# Patient Record
Sex: Female | Born: 1990 | Race: White | Hispanic: No | Marital: Married | Smoking: Former smoker
Health system: Southern US, Community
[De-identification: ages and names within clinical notes are randomized; demographics above are authoritative.]

## PROBLEM LIST (undated history)

## (undated) ENCOUNTER — Inpatient Hospital Stay: Payer: Self-pay

## (undated) DIAGNOSIS — B1920 Unspecified viral hepatitis C without hepatic coma: Secondary | ICD-10-CM

## (undated) DIAGNOSIS — B977 Papillomavirus as the cause of diseases classified elsewhere: Secondary | ICD-10-CM

## (undated) DIAGNOSIS — G43909 Migraine, unspecified, not intractable, without status migrainosus: Secondary | ICD-10-CM

## (undated) DIAGNOSIS — F1911 Other psychoactive substance abuse, in remission: Secondary | ICD-10-CM

## (undated) DIAGNOSIS — Z22322 Carrier or suspected carrier of Methicillin resistant Staphylococcus aureus: Secondary | ICD-10-CM

## (undated) DIAGNOSIS — R87612 Low grade squamous intraepithelial lesion on cytologic smear of cervix (LGSIL): Secondary | ICD-10-CM

## (undated) DIAGNOSIS — Z8742 Personal history of other diseases of the female genital tract: Secondary | ICD-10-CM

## (undated) HISTORY — DX: Migraine, unspecified, not intractable, without status migrainosus: G43.909

## (undated) HISTORY — DX: Personal history of other diseases of the female genital tract: Z87.42

## (undated) HISTORY — PX: COLONOSCOPY: SHX174

## (undated) HISTORY — DX: Papillomavirus as the cause of diseases classified elsewhere: B97.7

## (undated) HISTORY — DX: Unspecified viral hepatitis C without hepatic coma: B19.20

## (undated) HISTORY — DX: Other psychoactive substance abuse, in remission: F19.11

## (undated) HISTORY — DX: Low grade squamous intraepithelial lesion on cytologic smear of cervix (LGSIL): R87.612

## (undated) HISTORY — DX: Carrier or suspected carrier of methicillin resistant Staphylococcus aureus: Z22.322

## (undated) HISTORY — PX: TONSILLECTOMY: SUR1361

---

## 2015-05-21 HISTORY — PX: COLPOSCOPY: SHX161

## 2015-12-04 DIAGNOSIS — F411 Generalized anxiety disorder: Secondary | ICD-10-CM | POA: Insufficient documentation

## 2015-12-04 DIAGNOSIS — F1911 Other psychoactive substance abuse, in remission: Secondary | ICD-10-CM | POA: Insufficient documentation

## 2016-01-19 DIAGNOSIS — Z22322 Carrier or suspected carrier of Methicillin resistant Staphylococcus aureus: Secondary | ICD-10-CM

## 2016-01-19 HISTORY — DX: Carrier or suspected carrier of methicillin resistant Staphylococcus aureus: Z22.322

## 2016-12-30 DIAGNOSIS — R87612 Low grade squamous intraepithelial lesion on cytologic smear of cervix (LGSIL): Secondary | ICD-10-CM

## 2016-12-30 HISTORY — DX: Low grade squamous intraepithelial lesion on cytologic smear of cervix (LGSIL): R87.612

## 2017-03-12 ENCOUNTER — Emergency Department (HOSPITAL_COMMUNITY)
Admission: EM | Admit: 2017-03-12 | Discharge: 2017-03-12 | Disposition: A | Discharge status: Home / Self Care | Payer: Medicaid Other | Attending: Emergency Medicine | Admitting: Emergency Medicine

## 2017-03-12 ENCOUNTER — Emergency Department (HOSPITAL_COMMUNITY): Payer: Medicaid Other

## 2017-03-12 ENCOUNTER — Encounter (HOSPITAL_COMMUNITY): Payer: Self-pay | Admitting: Cardiology

## 2017-03-12 DIAGNOSIS — O209 Hemorrhage in early pregnancy, unspecified: Secondary | ICD-10-CM

## 2017-03-12 DIAGNOSIS — Z3A01 Less than 8 weeks gestation of pregnancy: Secondary | ICD-10-CM | POA: Diagnosis not present

## 2017-03-12 DIAGNOSIS — R109 Unspecified abdominal pain: Secondary | ICD-10-CM | POA: Insufficient documentation

## 2017-03-12 DIAGNOSIS — O208 Other hemorrhage in early pregnancy: Secondary | ICD-10-CM | POA: Diagnosis present

## 2017-03-12 DIAGNOSIS — O200 Threatened abortion: Secondary | ICD-10-CM | POA: Diagnosis not present

## 2017-03-12 LAB — COMPREHENSIVE METABOLIC PANEL
ALBUMIN: 4.4 g/dL (ref 3.5–5.0)
ALK PHOS: 111 U/L (ref 38–126)
ALT: 594 U/L — ABNORMAL HIGH (ref 14–54)
ANION GAP: 7 (ref 5–15)
AST: 287 U/L — ABNORMAL HIGH (ref 15–41)
BUN: 11 mg/dL (ref 6–20)
CALCIUM: 9.7 mg/dL (ref 8.9–10.3)
CHLORIDE: 105 mmol/L (ref 101–111)
CO2: 27 mmol/L (ref 22–32)
Creatinine, Ser: 0.91 mg/dL (ref 0.44–1.00)
GFR calc Af Amer: >60 mL/min (ref >60)
GFR calc non Af Amer: >60 mL/min (ref >60)
GLUCOSE: 87 mg/dL (ref 65–99)
POTASSIUM: 4 mmol/L (ref 3.5–5.1)
SODIUM: 139 mmol/L (ref 135–145)
Total Bilirubin: 0.6 mg/dL (ref 0.3–1.2)
Total Protein: 8 g/dL (ref 6.5–8.1)

## 2017-03-12 LAB — CBC WITH DIFFERENTIAL/PLATELET
BASOS PCT: 0 %
Basophils Absolute: 0 10*3/uL (ref 0.0–0.1)
EOS ABS: 0.3 10*3/uL (ref 0.0–0.7)
EOS PCT: 4 %
HCT: 45.1 % (ref 36.0–46.0)
HEMOGLOBIN: 14.8 g/dL (ref 12.0–15.0)
Lymphocytes Relative: 19 %
Lymphs Abs: 1.4 10*3/uL (ref 0.7–4.0)
MCH: 30.5 pg (ref 26.0–34.0)
MCHC: 32.8 g/dL (ref 30.0–36.0)
MCV: 93 fL (ref 78.0–100.0)
MONOS PCT: 6 %
Monocytes Absolute: 0.5 10*3/uL (ref 0.1–1.0)
NEUTROS PCT: 71 %
Neutro Abs: 5.3 10*3/uL (ref 1.7–7.7)
PLATELETS: 179 10*3/uL (ref 150–400)
RBC: 4.85 MIL/uL (ref 3.87–5.11)
RDW: 12.2 % (ref 11.5–15.5)
WBC: 7.4 10*3/uL (ref 4.0–10.5)

## 2017-03-12 LAB — I-STAT BETA HCG BLOOD, ED (MC, WL, AP ONLY): HCG, QUANTITATIVE: 22.8 m[IU]/mL — AB (ref <5)

## 2017-03-12 LAB — HCG, QUANTITATIVE, PREGNANCY: hCG, Beta Chain, Quant, S: 21 m[IU]/mL — ABNORMAL HIGH (ref <5)

## 2017-03-12 LAB — TYPE AND SCREEN
ABO/RH(D): O POS
ANTIBODY SCREEN: NEGATIVE

## 2017-03-12 MED ORDER — IBUPROFEN 800 MG PO TABS: 800.0000 mg | ORAL_TABLET | Freq: Three times a day (TID) | ORAL | 0 refills | Status: DC | PRN

## 2017-03-12 MED ORDER — SODIUM CHLORIDE 0.9 % IV BOLUS (SEPSIS)
1000.0000 mL | Freq: Once | INTRAVENOUS | Status: AC
  Administered 2017-03-12: 1000 mL via INTRAVENOUS

## 2017-03-12 NOTE — ED Triage Notes (Signed)
[redacted] weeks pregnant.  Vaginal bleeding and  Left sided abdominal pain that started this morning.

## 2017-03-12 NOTE — ED Notes (Signed)
Pt back from ultrasound.

## 2017-03-12 NOTE — ED Notes (Signed)
Pt has noticed a lot of vaginal bleeding this morning and states it ran half way down her legs. Is having a slight amount of cramping as well.

## 2017-03-12 NOTE — ED Notes (Signed)
Vaginal Examination kit being set up by NT

## 2017-03-12 NOTE — Discharge Instructions (Signed)
Follow-up with Dr.Eure in 2 weeks.  Also follow-up with your stomach specialist in 2-3 weeks for your elevated liver studies

## 2017-03-12 NOTE — ED Provider Notes (Signed)
[facility] EMERGENCY DEPARTMENT Provider Note   CSN: [id number] Arrival date & time: 03/12/17  1111     History   Chief Complaint Chief Complaint  Patient presents with  . Abdominal Pain    HPI April Keller is a 26 y.o. female.  Patient states that she is pregnant and started having bleeding and abdominal pain today   The history is provided by the patient. No language interpreter was used.  Abdominal Pain   This is a new problem. The current episode started more than 2 days ago. The problem occurs constantly. The problem has not changed since onset.The pain is associated with an unknown factor. The pain is located in the epigastric region. The quality of the pain is aching. The pain is at a severity of 4/10. The pain is moderate. Pertinent negatives include anorexia, diarrhea, frequency, hematuria and headaches. Nothing aggravates the symptoms. Nothing relieves the symptoms.    History reviewed. No pertinent past medical history.  There are no active problems to display for this patient.   Past Surgical History:  Procedure Laterality Date  . TONSILLECTOMY      OB History    Gravida Para Term Preterm AB Living   1             SAB TAB Ectopic Multiple Live Births                   Home Medications    Prior to Admission medications   Medication Sig Start Date End Date Taking? Authorizing Provider  ibuprofen (ADVIL,MOTRIN) 800 MG tablet Take 1 tablet (800 mg total) by mouth every 8 (eight) hours as needed for moderate pain. 03/12/17   Bethann Berkshire, MD    Family History History reviewed. No pertinent family history.  Social History Social History  Substance Use Topics  . Smoking status: Never Smoker  . Smokeless tobacco: Current User  . Alcohol use No     Allergies   Patient has no known allergies.   Review of Systems Review of Systems  Constitutional: Negative for appetite change and fatigue.  HENT: Negative for congestion, ear discharge and  sinus pressure.   Eyes: Negative for discharge.  Respiratory: Negative for cough.   Cardiovascular: Negative for chest pain.  Gastrointestinal: Positive for abdominal pain. Negative for anorexia and diarrhea.  Genitourinary: Negative for frequency and hematuria.  Musculoskeletal: Negative for back pain.  Skin: Negative for rash.  Neurological: Negative for seizures and headaches.  Psychiatric/Behavioral: Negative for hallucinations.     Physical Exam Updated Vital Signs BP 109/68 (BP Location: Right Arm)   Pulse 70   Temp 98.6 F (37 C) (Oral)   Resp 17   Ht 5' 8.5" (1.74 m)   Wt 90.7 kg (200 lb)   LMP 02/02/2017   SpO2 100%   BMI 29.97 kg/m   Physical Exam  Constitutional: She is oriented to person, place, and time. She appears well-developed.  HENT:  Head: Normocephalic.  Eyes: Conjunctivae and EOM are normal. No scleral icterus.  Neck: Neck supple. No thyromegaly present.  Cardiovascular: Normal rate and regular rhythm.  Exam reveals no gallop and no friction rub.   No murmur heard. Pulmonary/Chest: No stridor. She has no wheezes. She has no rales. She exhibits no tenderness.  Abdominal: She exhibits no distension. There is no tenderness. There is no rebound.  Genitourinary:  Genitourinary Comments: Os closed.   Mild bleeding vaginal vault  Musculoskeletal: Normal range of motion. She exhibits no edema.  Lymphadenopathy:    She has no cervical adenopathy.  Neurological: She is oriented to person, place, and time. She exhibits normal muscle tone. Coordination normal.  Skin: No rash noted. No erythema.  Psychiatric: She has a normal mood and affect. Her behavior is normal.     ED Treatments / Results  Labs (all labs ordered are listed, but only abnormal results are displayed) Labs Reviewed  HCG, QUANTITATIVE, PREGNANCY - Abnormal; Notable for the following:       Result Value   hCG, Beta Chain, Quant, S 21 (*)    All other components within normal limits    COMPREHENSIVE METABOLIC PANEL - Abnormal; Notable for the following:    AST 287 (*)    ALT 594 (*)    All other components within normal limits  I-STAT BETA HCG BLOOD, ED ([facility], [facility], [facility] ONLY) - Abnormal; Notable for the following:    I-stat hCG, quantitative 22.8 (*)    All other components within normal limits  CBC WITH DIFFERENTIAL/PLATELET  TYPE AND SCREEN    EKG  EKG Interpretation None       Radiology US Ob Comp Less 14 Wks  Result Date: 03/12/2017 CLINICAL DATA:  Pregnant at approximately 6 weeks, vaginal bleeding affecting early pregnancy, LEFT lower quadrant discomfort for 1 day ; EGA [redacted] weeks 3 days by LMP ; quantitative beta HCG = 22.8 EXAM: OBSTETRIC <14 WK [place] AND TRANSVAGINAL OB US TECHNIQUE: Both transabdominal and transvaginal ultrasound examinations were performed for complete evaluation of the gestation as well as the maternal uterus, adnexal regions, and pelvic cul-de-sac. Transvaginal technique was performed to assess early pregnancy. COMPARISON:  None FINDINGS: Intrauterine gestational sac: Not identified Yolk sac:  N/A Embryo:  N/A Cardiac Activity: N/A Heart Rate: N/A  bpm Subchorionic hemorrhage:  N/A Maternal uterus/adnexae: Uterus normal appearance with normal thickness of endometrial complex. No uterine mass, fluid collection or gestational sac. Tiny amount of fluid seen within the endometrial canal at the lower uterine segment. Ovaries normal appearance with small corpus luteal cyst noted in LEFT ovary. No free pelvic fluid or adnexal masses. IMPRESSION: No intrauterine gestation identified. Findings are compatible with pregnancy of unknown location. Differential diagnosis includes early intrauterine pregnancy too early to visualize, spontaneous abortion, and ectopic pregnancy. Serial quantitative beta HCG and or followup ultrasound recommended to definitively exclude ectopic pregnancy. Electronically Signed   By: Ulyses Southward M.D.   On: 03/12/2017 13:12   US Ob  Transvaginal  Result Date: 03/12/2017 CLINICAL DATA:  Pregnant at approximately 6 weeks, vaginal bleeding affecting early pregnancy, LEFT lower quadrant discomfort for 1 day ; EGA [redacted] weeks 3 days by LMP ; quantitative beta HCG = 22.8 EXAM: OBSTETRIC <14 WK [place] AND TRANSVAGINAL OB US TECHNIQUE: Both transabdominal and transvaginal ultrasound examinations were performed for complete evaluation of the gestation as well as the maternal uterus, adnexal regions, and pelvic cul-de-sac. Transvaginal technique was performed to assess early pregnancy. COMPARISON:  None FINDINGS: Intrauterine gestational sac: Not identified Yolk sac:  N/A Embryo:  N/A Cardiac Activity: N/A Heart Rate: N/A  bpm Subchorionic hemorrhage:  N/A Maternal uterus/adnexae: Uterus normal appearance with normal thickness of endometrial complex. No uterine mass, fluid collection or gestational sac. Tiny amount of fluid seen within the endometrial canal at the lower uterine segment. Ovaries normal appearance with small corpus luteal cyst noted in LEFT ovary. No free pelvic fluid or adnexal masses. IMPRESSION: No intrauterine gestation identified. Findings are compatible with pregnancy of unknown location. Differential diagnosis includes  early intrauterine pregnancy too early to visualize, spontaneous abortion, and ectopic pregnancy. Serial quantitative beta HCG and or followup ultrasound recommended to definitively exclude ectopic pregnancy. Electronically Signed   By: Ulyses SouthwardMark  Boles M.D.   On: 03/12/2017 13:12    Procedures Procedures (including critical care time)  Medications Ordered in ED Medications  sodium chloride 0.9 % bolus 1,000 mL (1,000 mLs Intravenous New Bag/Given 03/12/17 1222)     Initial Impression / Assessment and Plan / ED Course  I have reviewed the triage vital signs and the nursing notes.  Pertinent labs & imaging results that were available during my care of the patient were reviewed by me and considered in my medical  decision making (see chart for details).    Patient with spontaneous abortion.  Also has elevated liver enzymes from history of hepatitis C.  She will follow-up with OB/GYN in 2 weeks to follow her quantitative beta hCGs and will follow up with her GI doctor to check her liver studies   Final Clinical Impressions(s) / ED Diagnoses   Final diagnoses:  Threatened abortion    New Prescriptions New Prescriptions   IBUPROFEN (ADVIL,MOTRIN) 800 MG TABLET    Take 1 tablet (800 mg total) by mouth every 8 (eight) hours as needed for moderate pain.     Bethann BerkshireZammit, Teigen Parslow, MD 03/12/17 1504

## 2017-05-20 NOTE — L&D Delivery Note (Signed)
Delivery Note At 8:45 PM a viable female infant was delivered via Vaginal, Spontaneous (Presentation: OA).  APGAR: 8, 9; weight pending .   Placenta status: delivered spontaneously, intact.  Cord: 3VC with the following complications: None.  Cord pH: n/a  Anesthesia: epidural  Episiotomy: None Lacerations: None Suture Repair: n/a Est. Blood Loss (mL): 350  Mom to postpartum.  Baby to Couplet care / Skin to Skin.  Called to see patient.  Mom pushed to deliver a viable female infant.  The head followed by shoulders, which delivered without difficulty, and the rest of the body.  No nuchal cord noted.  Baby to mom's chest.  Cord clamped and cut after > 1 min delay.  No cord blood obtained.  Placenta delivered spontaneously, intact, with a 3-vessel cord.  No vaginal, cervical, or perineal lacerations.   Small abrasions on vulva, hemostatic without repair. All counts correct.  Hemostasis obtained with IV pitocin and fundal massage. EBL 350 mL.     Thomasene MohairStephen Jackson, MD 02/03/2018, 9:04 PM

## 2017-06-18 ENCOUNTER — Other Ambulatory Visit (INDEPENDENT_AMBULATORY_CARE_PROVIDER_SITE_OTHER): Payer: Medicaid Other

## 2017-06-18 ENCOUNTER — Ambulatory Visit (INDEPENDENT_AMBULATORY_CARE_PROVIDER_SITE_OTHER): Payer: Medicaid Other | Admitting: Certified Nurse Midwife

## 2017-06-18 ENCOUNTER — Encounter: Payer: Self-pay | Admitting: Certified Nurse Midwife

## 2017-06-18 VITALS — BP 110/70 | HR 99 | Wt 216.0 lb

## 2017-06-18 DIAGNOSIS — Z23 Encounter for immunization: Secondary | ICD-10-CM

## 2017-06-18 DIAGNOSIS — O0991 Supervision of high risk pregnancy, unspecified, first trimester: Secondary | ICD-10-CM

## 2017-06-18 DIAGNOSIS — O09299 Supervision of pregnancy with other poor reproductive or obstetric history, unspecified trimester: Secondary | ICD-10-CM | POA: Diagnosis not present

## 2017-06-18 DIAGNOSIS — R748 Abnormal levels of other serum enzymes: Secondary | ICD-10-CM

## 2017-06-18 DIAGNOSIS — F1991 Other psychoactive substance use, unspecified, in remission: Secondary | ICD-10-CM

## 2017-06-18 DIAGNOSIS — E669 Obesity, unspecified: Secondary | ICD-10-CM

## 2017-06-18 DIAGNOSIS — Z87898 Personal history of other specified conditions: Secondary | ICD-10-CM

## 2017-06-18 DIAGNOSIS — O0990 Supervision of high risk pregnancy, unspecified, unspecified trimester: Secondary | ICD-10-CM

## 2017-06-18 DIAGNOSIS — F1911 Other psychoactive substance abuse, in remission: Secondary | ICD-10-CM

## 2017-06-18 DIAGNOSIS — R768 Other specified abnormal immunological findings in serum: Secondary | ICD-10-CM

## 2017-06-18 NOTE — Progress Notes (Signed)
NOB today. Pregnancy confirmed at [facility]. Pt c/o nausea.
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
## 2017-06-19 LAB — RPR+RH+ABO+RUB AB+AB SCR+CB...
Antibody Screen: NEGATIVE
HEMATOCRIT: 43.5 % (ref 34.0–46.6)
HEP B S AG: NEGATIVE
HIV Screen 4th Generation wRfx: NONREACTIVE
Hemoglobin: 14.7 g/dL (ref 11.1–15.9)
MCH: 30.6 pg (ref 26.6–33.0)
MCHC: 33.8 g/dL (ref 31.5–35.7)
MCV: 90 fL (ref 79–97)
Platelets: 209 10*3/uL (ref 150–379)
RBC: 4.81 x10E6/uL (ref 3.77–5.28)
RDW: 13.1 % (ref 12.3–15.4)
RH TYPE: POSITIVE
RPR: NONREACTIVE
VARICELLA: 1036 {index} (ref >165)
WBC: 9.8 10*3/uL (ref 3.4–10.8)

## 2017-06-19 LAB — COMPREHENSIVE METABOLIC PANEL
A/G RATIO: 1.4 (ref 1.2–2.2)
ALBUMIN: 4.5 g/dL (ref 3.5–5.5)
ALK PHOS: 147 IU/L — AB (ref 39–117)
ALT: 452 IU/L — ABNORMAL HIGH (ref 0–32)
AST: 186 IU/L — ABNORMAL HIGH (ref 0–40)
BILIRUBIN TOTAL: 0.4 mg/dL (ref 0.0–1.2)
BUN / CREAT RATIO: 11 (ref 9–23)
BUN: 10 mg/dL (ref 6–20)
CHLORIDE: 99 mmol/L (ref 96–106)
CO2: 23 mmol/L (ref 20–29)
Calcium: 9.9 mg/dL (ref 8.7–10.2)
Creatinine, Ser: 0.91 mg/dL (ref 0.57–1.00)
GFR calc non Af Amer: 89 mL/min/{1.73_m2} (ref >59)
GFR, EST AFRICAN AMERICAN: 102 mL/min/{1.73_m2} (ref >59)
GLOBULIN, TOTAL: 3.3 g/dL (ref 1.5–4.5)
Glucose: 74 mg/dL (ref 65–99)
POTASSIUM: 4.5 mmol/L (ref 3.5–5.2)
SODIUM: 138 mmol/L (ref 134–144)
TOTAL PROTEIN: 7.8 g/dL (ref 6.0–8.5)

## 2017-06-20 ENCOUNTER — Encounter: Payer: Self-pay | Admitting: Certified Nurse Midwife

## 2017-06-20 LAB — CHLAMYDIA/GONOCOCCUS/TRICHOMONAS, NAA
CHLAMYDIA BY NAA: NEGATIVE
Gonococcus by NAA: NEGATIVE
TRICH VAG BY NAA: NEGATIVE

## 2017-06-20 LAB — URINE CULTURE

## 2017-06-20 LAB — URINE DRUG PANEL 7
Amphetamines, Urine: NEGATIVE ng/mL
Barbiturate Quant, Ur: NEGATIVE ng/mL
Benzodiazepine Quant, Ur: NEGATIVE ng/mL
CANNABINOID QUANT UR: NEGATIVE ng/mL
Cocaine (Metab.): NEGATIVE ng/mL
Opiate Quant, Ur: NEGATIVE ng/mL
PCP QUANT UR: NEGATIVE ng/mL

## 2017-06-21 LAB — SPECIMEN STATUS REPORT

## 2017-06-21 LAB — HEPATITIS C ANTIBODY: Hep C Virus Ab: >11 s/co ratio — ABNORMAL HIGH (ref 0.0–0.9)

## 2017-06-25 ENCOUNTER — Telehealth: Payer: Self-pay | Admitting: Certified Nurse Midwife

## 2017-06-25 ENCOUNTER — Encounter: Payer: Self-pay | Admitting: Certified Nurse Midwife

## 2017-06-25 DIAGNOSIS — O0990 Supervision of high risk pregnancy, unspecified, unspecified trimester: Secondary | ICD-10-CM | POA: Insufficient documentation

## 2017-06-25 DIAGNOSIS — R768 Other specified abnormal immunological findings in serum: Secondary | ICD-10-CM | POA: Insufficient documentation

## 2017-06-25 DIAGNOSIS — R7401 Elevation of levels of liver transaminase levels: Secondary | ICD-10-CM

## 2017-06-25 DIAGNOSIS — R748 Abnormal levels of other serum enzymes: Secondary | ICD-10-CM | POA: Insufficient documentation

## 2017-06-25 DIAGNOSIS — F1991 Other psychoactive substance use, unspecified, in remission: Secondary | ICD-10-CM | POA: Insufficient documentation

## 2017-06-25 DIAGNOSIS — Z8742 Personal history of other diseases of the female genital tract: Secondary | ICD-10-CM | POA: Insufficient documentation

## 2017-06-25 DIAGNOSIS — F1911 Other psychoactive substance abuse, in remission: Secondary | ICD-10-CM | POA: Insufficient documentation

## 2017-06-25 DIAGNOSIS — E669 Obesity, unspecified: Secondary | ICD-10-CM | POA: Insufficient documentation

## 2017-06-25 DIAGNOSIS — Z87898 Personal history of other specified conditions: Secondary | ICD-10-CM | POA: Insufficient documentation

## 2017-06-25 DIAGNOSIS — R740 Nonspecific elevation of levels of transaminase and lactic acid dehydrogenase [LDH]: Secondary | ICD-10-CM

## 2017-06-25 NOTE — Telephone Encounter (Signed)
Patient called regarding lab work. AST, ALT, and alkaline phosphatase all elevated with positive hepatitis C antibody. Will have patient come in for hepatitis C RNA test and refer to hepatologist for further evaluation. [facility]

## 2017-06-26 ENCOUNTER — Other Ambulatory Visit: Payer: Medicaid Other

## 2017-06-26 DIAGNOSIS — O0990 Supervision of high risk pregnancy, unspecified, unspecified trimester: Secondary | ICD-10-CM

## 2017-06-26 DIAGNOSIS — R7401 Elevation of levels of liver transaminase levels: Secondary | ICD-10-CM

## 2017-06-26 DIAGNOSIS — R768 Other specified abnormal immunological findings in serum: Secondary | ICD-10-CM

## 2017-06-26 DIAGNOSIS — R740 Nonspecific elevation of levels of transaminase and lactic acid dehydrogenase [LDH]: Secondary | ICD-10-CM

## 2017-06-26 DIAGNOSIS — E669 Obesity, unspecified: Secondary | ICD-10-CM

## 2017-06-28 LAB — HEPATITIS C VRS RNA DETECT BY PCR-QUAL: HCV RNA NAA QUALITATIVE: POSITIVE — AB

## 2017-06-30 ENCOUNTER — Telehealth: Payer: Self-pay | Admitting: Certified Nurse Midwife

## 2017-06-30 DIAGNOSIS — B1920 Unspecified viral hepatitis C without hepatic coma: Secondary | ICD-10-CM

## 2017-06-30 DIAGNOSIS — R748 Abnormal levels of other serum enzymes: Secondary | ICD-10-CM

## 2017-06-30 DIAGNOSIS — Z3A08 8 weeks gestation of pregnancy: Secondary | ICD-10-CM

## 2017-06-30 DIAGNOSIS — F1911 Other psychoactive substance abuse, in remission: Secondary | ICD-10-CM

## 2017-06-30 NOTE — Telephone Encounter (Signed)
Patient called and advised that her Hepatitis C RNA was positive. Will consult MFM and will probably need consult to hepatologist. Farrel Connersolleen Alba Perillo, CNM

## 2017-07-10 ENCOUNTER — Ambulatory Visit
Admission: RE | Admit: 2017-07-10 | Discharge: 2017-07-10 | Disposition: A | Discharge status: Home / Self Care | Payer: Medicaid Other | Source: Ambulatory Visit | Attending: Obstetrics & Gynecology | Admitting: Obstetrics & Gynecology

## 2017-07-10 VITALS — BP 121/77 | HR 90 | Temp 98.2°F | Resp 18 | Wt 219.4 lb

## 2017-07-10 DIAGNOSIS — R748 Abnormal levels of other serum enzymes: Secondary | ICD-10-CM

## 2017-07-10 DIAGNOSIS — O98411 Viral hepatitis complicating pregnancy, first trimester: Secondary | ICD-10-CM | POA: Diagnosis present

## 2017-07-10 DIAGNOSIS — Z333 Pregnant state, gestational carrier: Secondary | ICD-10-CM

## 2017-07-10 DIAGNOSIS — R768 Other specified abnormal immunological findings in serum: Secondary | ICD-10-CM | POA: Diagnosis not present

## 2017-07-10 DIAGNOSIS — F1991 Other psychoactive substance use, unspecified, in remission: Secondary | ICD-10-CM

## 2017-07-10 DIAGNOSIS — Z3A09 9 weeks gestation of pregnancy: Secondary | ICD-10-CM | POA: Insufficient documentation

## 2017-07-10 DIAGNOSIS — Z87898 Personal history of other specified conditions: Secondary | ICD-10-CM | POA: Diagnosis not present

## 2017-07-10 DIAGNOSIS — R7989 Other specified abnormal findings of blood chemistry: Secondary | ICD-10-CM | POA: Insufficient documentation

## 2017-07-10 DIAGNOSIS — E669 Obesity, unspecified: Secondary | ICD-10-CM

## 2017-07-10 DIAGNOSIS — B1920 Unspecified viral hepatitis C without hepatic coma: Secondary | ICD-10-CM | POA: Diagnosis not present

## 2017-07-10 LAB — PROTIME-INR
INR: 0.97
Prothrombin Time: 12.8 seconds (ref 11.4–15.2)

## 2017-07-10 LAB — COMPREHENSIVE METABOLIC PANEL
ALBUMIN: 4.1 g/dL (ref 3.5–5.0)
ALK PHOS: 80 U/L (ref 38–126)
ALT: 90 U/L — AB (ref 14–54)
AST: 40 U/L (ref 15–41)
Anion gap: 9 (ref 5–15)
BUN: 13 mg/dL (ref 6–20)
CALCIUM: 9.2 mg/dL (ref 8.9–10.3)
CO2: 22 mmol/L (ref 22–32)
CREATININE: 0.83 mg/dL (ref 0.44–1.00)
Chloride: 104 mmol/L (ref 101–111)
GFR calc Af Amer: >60 mL/min (ref >60)
GFR calc non Af Amer: >60 mL/min (ref >60)
GLUCOSE: 87 mg/dL (ref 65–99)
POTASSIUM: 3.9 mmol/L (ref 3.5–5.1)
Sodium: 135 mmol/L (ref 135–145)
TOTAL PROTEIN: 8.2 g/dL — AB (ref 6.5–8.1)
Total Bilirubin: 0.5 mg/dL (ref 0.3–1.2)

## 2017-07-10 LAB — FIBRINOGEN: Fibrinogen: 525 mg/dL — ABNORMAL HIGH (ref 210–475)

## 2017-07-10 LAB — APTT: aPTT: 29 seconds (ref 24–36)

## 2017-07-10 NOTE — Telephone Encounter (Signed)
[facility] to [facility] do to high risk pregnancy.  [facility] and they will call patient with appointment date and  time.

## 2017-07-10 NOTE — Progress Notes (Addendum)
[place] Consultation  Referring Provider: Mosetta Pigeon Reason for Referral: Pregnancy with hepatitis C  April Keller is a 27 year-old G2 P0010 at 80 3/7 weeks ([place] 02/09/18) who is referred for recommendations concerning the management of hepatitis C in pregnancy.  Ms. April Keller was diagnosed with Hepatitis C approximately 4-5 years ago when she presented to a hospital with flu-like symptoms.  She was diagnosed with having hepatitis C and then was followed by a GI group in [facility].  She ultimately cleared the virus without treatment.   At the time she presented she was using IV drugs (heroin, meth, cocaine, THC) but has now been clean for over 2 years. She is not on opioid maintenance  She overall feels well. Routine blood work at her new OB visit demonstrated that her LFTs were elevated and that her PCR test is now positive again.  She denies a history of jaundice.  She denies vaginal bleeding or abdominal pain. No easy bruising or bleeding complications.  She is not sure if she has had a right upper quadrant ultrasound recently or if she has been tested for hepatitis A.   PMH: Hep C positive with recent PCR positive  PSH: Tonsillectomy PObH: G2 P0010   2018; SAB at 5 weeks, no D&C required PGYN: History of HPV and abnromal paps. No conization or LEEP.  Meds: PNV All: KNDA SH: No longer using drugs. Now vapes. No tobacco. Lives with husband. Safe at home. Unemployed FH: No FH of babies with birth defects, MR or genetic disorders ROS: See HPI  Exam:  Vitals:   07/10/17 0847  BP: 121/77  Pulse: 90  Resp: 18  Temp: 98.2 F (36.8 C)  SpO2: 97%    Prenatal Labs [facility], 07/02/17): Blood type O positive, antibody screen neg, Rubella Non-immue, Hep B neg, RPR non-reactive, Varicella immune, Hep C antibody positive, Hep C viral pcr positive, Hct 43.5, MCV 90, Plt 209  06/18/17: AST 186, ALT 452, Alk phos 147, total bili 0.4, Cr 0.9 03/12/17: AST 287, ALT 594, Alk  phos 111, total bili 0.6, CR 0.91  Assessment and Recommendations: 27 year-old G2 P0010 at 25 3/7 weeks with history of IV drug abuse, now in readmission, hepatitis C, and eleavated LFTs.  She reports having previously cleared the Hep C virus but her recent Hep C RNA pcr was positive, consistent with active infection and presence of virus.  She feels well and has no bruising/bleeding complications.  We discussed the risk for vertical transmission of Hep C to her neonate.  Without HIV co-infection, the Hep C transimission rate is thought to be approximately 5%.  Mode of delivery does not impact risk for vertical transmission, thus decisions on mode of delivery should be based on obstetric indications.  Recommendations are to avoid fetal scalp electrodes during labor. In addition, the duration of rupture of membranes and impact on vertical transmission rates are not known.  April Keller's recent LFTs have been elevated. Pregnancy is not thought to impact the progression of liver disease but having underlying liver disease, she is at risk for hypertensive disorders of pregnancy.  In some cases, LFTs improve during pregnancy.  -Repeat LFTs sent today  -Referral made to [facility] as she needs a hepatolgist to follow her through the pregnancy and postpartum and also to help determine if she is a candidate for Sovaldi after she delivers. -Right Upper Quadrant [place] ordered -Check LFTs at least every trimester -Continue to screen for preeclampsia -Alert  the pediatricans at delivery so that the neonate can be followed -Detailed anatomic ultrasound at 18-20 weeks -Offer aneuploidy and ONTD screening -Avoid long courses of tylenol -PT/PTT sent to assess synthetic function -MMR vaccine postpartum -Hepatitis A antibody also sent -We discussed the need for her partner to be tested and recommendations for barrier methods of birth control. Her partner will contact the [facility] for testing  Shaheer Bonfield, [place] A,  MD

## 2017-07-10 NOTE — Addendum Note (Signed)
Encounter addended by: Gurtej Noyola, [place], MD on: 07/10/2017 1:13 PM  Actions taken: Sign clinical note

## 2017-07-10 NOTE — Progress Notes (Signed)
[facility]  They will schedule consult once records received.  Scheduling to call patient to schedule right upper quad [facility].

## 2017-07-11 LAB — HEPATITIS A ANTIBODY, TOTAL: Hep A Total Ab: POSITIVE — AB

## 2017-07-13 LAB — HEPATITIS C GENOTYPE

## 2017-07-13 LAB — HCV RNA QUANT RFLX ULTRA OR GENOTYP
HCV RNA Qnt(log copy/mL): 4.358 log10 IU/mL
HEPATITIS C QUANTITATION: 22800 [IU]/mL

## 2017-07-15 ENCOUNTER — Ambulatory Visit: Payer: Medicaid Other

## 2017-07-16 ENCOUNTER — Other Ambulatory Visit: Payer: Medicaid Other

## 2017-07-16 ENCOUNTER — Encounter: Payer: Medicaid Other | Admitting: Maternal Newborn

## 2017-07-17 ENCOUNTER — Other Ambulatory Visit: Payer: Medicaid Other

## 2017-07-17 ENCOUNTER — Ambulatory Visit: Payer: Medicaid Other

## 2017-07-17 ENCOUNTER — Ambulatory Visit (INDEPENDENT_AMBULATORY_CARE_PROVIDER_SITE_OTHER): Payer: Medicaid Other | Admitting: Obstetrics and Gynecology

## 2017-07-17 VITALS — BP 120/74 | Wt 220.0 lb

## 2017-07-17 DIAGNOSIS — Z124 Encounter for screening for malignant neoplasm of cervix: Secondary | ICD-10-CM

## 2017-07-17 DIAGNOSIS — O99211 Obesity complicating pregnancy, first trimester: Secondary | ICD-10-CM

## 2017-07-17 DIAGNOSIS — O0990 Supervision of high risk pregnancy, unspecified, unspecified trimester: Secondary | ICD-10-CM

## 2017-07-17 NOTE — Progress Notes (Signed)
Routine Prenatal Care Visit  Subjective  April SanfilippoJessica Hege is a 27 y.o. G2P0010 at 6960w3d being seen today for ongoing prenatal care.  She is currently monitored for the following issues for this high-risk pregnancy and has Supervision of high risk pregnancy, antepartum; Elevated liver enzymes; Positive hepatitis C antibody test; History of intravenous drug use in remission; Substance abuse in remission (HCC); Low grade squamous intraepithelial lesion (LGSIL) on cervical Pap smear; History of abnormal cervical Pap smear; MRSA (methicillin resistant staph aureus) culture positive; Obesity (BMI 30.0-34.9); and Pregnant state, gestational carrier on their problem list.  ----------------------------------------------------------------------------------- Patient reports no complaints.    .  .   . Denies leaking of fluid.  ----------------------------------------------------------------------------------- The following portions of the patient's history were reviewed and updated as appropriate: allergies, current medications, past family history, past medical history, past social history, past surgical history and problem list. Problem list updated.   Objective  Blood pressure 120/74, weight 220 lb (99.8 kg), last menstrual period 05/05/2017. Pregravid weight 216 lb (98 kg) Total Weight Gain 4 lb (1.814 kg) Urinalysis:      Fetal Status:           General:  Alert, oriented and cooperative. Patient is in no acute distress.  Skin: Skin is warm and dry. No rash noted.   Cardiovascular: Normal heart rate noted  Respiratory: Normal respiratory effort, no problems with respiration noted  Abdomen: Soft, gravid, appropriate for gestational age.       Pelvic:  Cervical exam deferred        Extremities: Normal range of motion.     ental Status: Normal mood and affect. Normal behavior. Normal judgment and thought content.     Assessment   27 y.o. G2P0010 at 7960w3d by  02/09/2018, by Last Menstrual Period  presenting for routine prenatal visit  Plan   pregnancy #2 Problems (from 06/18/17 to present)    Problem Noted Resolved   Pregnant state, gestational carrier 07/10/2017 by Grotegut, [place], MD No   Supervision of high risk pregnancy, antepartum 06/25/2017 by Farrel ConnersGutierrez, Colleen, CNM No   Overview Addendum 07/17/2017  9:35 PM by Vena AustriaStaebler, Aurilla Coulibaly, MD     Clinic [facility] Prenatal Labs  Dating LMP c/w 6wk1d ultrasound Blood type: O/Positive/-- (01/30 1040)   Genetic Screen Declines Antibody:Negative (01/30 1040)  Anatomic US  Rubella: <0.90 (01/30 1040)/ Varicella Immune  GTT Early:               Third trimester:  RPR: Non Reactive (01/30 1040)   Flu vaccine  HBsAg: Negative (01/30 1040)   TDaP vaccine                                               Rhogam: N/A HIV: Non Reactive (01/30 1040)   Baby Food                                               GBS: (For PCN allergy, check sensitivities)  Contraception  Pap: LGSIL PAP with +HRHPV 12/20/2016  Circumcision    Pediatrician    Support Person             Obesity (BMI 30.0-34.9) 06/25/2017 by Farrel ConnersGutierrez, Colleen, CNM No  Gestational age appropriate obstetric precautions including but not limited to vaginal bleeding, contractions, leaking of fluid and fetal movement were reviewed in detail with the patient.   - pap repeated today - declines genetics  Return in about 4 weeks (around 08/14/2017) for rob.  Vena Austria, MD, [facility], [facility] 07/17/2017, 9:36 PM

## 2017-07-17 NOTE — Progress Notes (Signed)
Rob Early GTT

## 2017-07-18 ENCOUNTER — Encounter: Payer: Self-pay | Admitting: Obstetrics and Gynecology

## 2017-07-18 LAB — GLUCOSE TOLERANCE, 1 HOUR: GLUCOSE, 1HR PP: 74 mg/dL (ref 65–199)

## 2017-07-21 LAB — PAP IG W/ RFLX HPV ASCU: PAP SMEAR COMMENT: 0

## 2017-07-22 ENCOUNTER — Encounter: Payer: Self-pay | Admitting: Obstetrics and Gynecology

## 2017-07-23 ENCOUNTER — Ambulatory Visit: Admission: RE | Admit: 2017-07-23 | Payer: Medicaid Other | Source: Ambulatory Visit

## 2017-07-31 ENCOUNTER — Ambulatory Visit (HOSPITAL_COMMUNITY)
Admission: RE | Admit: 2017-07-31 | Discharge: 2017-07-31 | Disposition: A | Discharge status: Home / Self Care | Payer: Medicaid Other | Source: Ambulatory Visit | Attending: Obstetrics & Gynecology | Admitting: Obstetrics & Gynecology

## 2017-07-31 DIAGNOSIS — R768 Other specified abnormal immunological findings in serum: Secondary | ICD-10-CM

## 2017-07-31 DIAGNOSIS — O98419 Viral hepatitis complicating pregnancy, unspecified trimester: Secondary | ICD-10-CM | POA: Diagnosis not present

## 2017-07-31 DIAGNOSIS — B1920 Unspecified viral hepatitis C without hepatic coma: Secondary | ICD-10-CM | POA: Diagnosis not present

## 2017-07-31 DIAGNOSIS — R748 Abnormal levels of other serum enzymes: Secondary | ICD-10-CM | POA: Insufficient documentation

## 2017-07-31 DIAGNOSIS — Z333 Pregnant state, gestational carrier: Secondary | ICD-10-CM

## 2017-08-14 ENCOUNTER — Ambulatory Visit (INDEPENDENT_AMBULATORY_CARE_PROVIDER_SITE_OTHER): Payer: Medicaid Other | Admitting: Obstetrics and Gynecology

## 2017-08-14 ENCOUNTER — Other Ambulatory Visit: Payer: Self-pay | Admitting: Obstetrics and Gynecology

## 2017-08-14 ENCOUNTER — Encounter: Payer: Self-pay | Admitting: Obstetrics and Gynecology

## 2017-08-14 VITALS — BP 120/60 | Wt 227.0 lb

## 2017-08-14 DIAGNOSIS — Z333 Pregnant state, gestational carrier: Secondary | ICD-10-CM

## 2017-08-14 DIAGNOSIS — Z3A14 14 weeks gestation of pregnancy: Secondary | ICD-10-CM

## 2017-08-14 DIAGNOSIS — F1991 Other psychoactive substance use, unspecified, in remission: Secondary | ICD-10-CM

## 2017-08-14 DIAGNOSIS — Z87898 Personal history of other specified conditions: Secondary | ICD-10-CM

## 2017-08-14 DIAGNOSIS — R768 Other specified abnormal immunological findings in serum: Secondary | ICD-10-CM

## 2017-08-14 DIAGNOSIS — F1911 Other psychoactive substance abuse, in remission: Secondary | ICD-10-CM

## 2017-08-14 DIAGNOSIS — O0992 Supervision of high risk pregnancy, unspecified, second trimester: Secondary | ICD-10-CM

## 2017-08-14 DIAGNOSIS — O0990 Supervision of high risk pregnancy, unspecified, unspecified trimester: Secondary | ICD-10-CM

## 2017-08-14 LAB — POCT URINALYSIS DIPSTICK
Bilirubin, UA: NEGATIVE
Glucose, UA: NEGATIVE
KETONES UA: POSITIVE
Leukocytes, UA: NEGATIVE
Nitrite, UA: NEGATIVE
PH UA: 7.5 (ref 5.0–8.0)
PROTEIN UA: NEGATIVE
RBC UA: NEGATIVE
SPEC GRAV UA: 1.015 (ref 1.010–1.025)
UROBILINOGEN UA: NEGATIVE U/dL — AB

## 2017-08-14 NOTE — Progress Notes (Signed)
Rob No vg. No lof

## 2017-08-14 NOTE — Progress Notes (Signed)
Routine Prenatal Care Visit  Subjective  April Keller is a 27 y.o. G2P0010 at [redacted]w[redacted]d being seen today for ongoing prenatal care.  She is currently monitored for the following issues for this high-risk pregnancy and has Supervision of high risk pregnancy, antepartum; Elevated liver enzymes; Positive hepatitis C antibody test; History of intravenous drug use in remission; Substance abuse in remission (HCC); Low grade squamous intraepithelial lesion (LGSIL) on cervical Pap smear; History of abnormal cervical Pap smear; MRSA (methicillin resistant staph aureus) culture positive; Obesity (BMI 30.0-34.9); and Pregnant state, gestational carrier on their problem list.  ----------------------------------------------------------------------------------- Patient reports dysuria.   Contractions: Not present. Vag. Bleeding: None.   . Denies leaking of fluid.  ----------------------------------------------------------------------------------- The following portions of the patient's history were reviewed and updated as appropriate: allergies, current medications, past family history, past medical history, past social history, past surgical history and problem list. Problem list updated.   Objective  Blood pressure 120/60, weight 227 lb (103 kg), last menstrual period 05/05/2017. Pregravid weight 216 lb (98 kg) Total Weight Gain 11 lb (4.99 kg) Urinalysis:      Fetal Status: Fetal Heart Rate (bpm): 156         General:  Alert, oriented and cooperative. Patient is in no acute distress.  Skin: Skin is warm and dry. No rash noted.   Cardiovascular: Normal heart rate noted  Respiratory: Normal respiratory effort, no problems with respiration noted  Abdomen: Soft, gravid, appropriate for gestational age. Pain/Pressure: Absent     Pelvic:  Cervical exam deferred by patient        Extremities: Normal range of motion.     ental Status: Normal mood and affect. Normal behavior. Normal judgment and thought  content.     Assessment   27 y.o. G2P0010 at [redacted]w[redacted]d by  02/09/2018, by Last Menstrual Period presenting for routine prenatal visit  Plan   pregnancy #2 Problems (from 06/18/17 to present)    Problem Noted Resolved   Pregnant state, gestational carrier 07/10/2017 by Grotegut, [place], MD No   Supervision of high risk pregnancy, antepartum 06/25/2017 by Farrel Conners, CNM No   Overview Addendum 08/14/2017  2:38 PM by Natale Milch, MD     Clinic [facility] Prenatal Labs  Dating LMP c/w 6wk1d ultrasound Blood type: O/Positive/-- (01/30 1040)   Genetic Screen Declines Antibody:Negative (01/30 1040)  Anatomic [place]  Rubella: <0.90 (01/30 1040)/ Varicella Immune  GTT Early:  78             Third trimester:  RPR: Non Reactive (01/30 1040)   Flu vaccine  06/18/2017 HBsAg: Negative (01/30 1040)   TDaP vaccine                                               Rhogam: N/A HIV: Non Reactive (01/30 1040)   Baby Food  breast/bottle                                             GBS: (For PCN allergy, check sensitivities)  Contraception  nexplanon Pap: LGSIL PAP with +HRHPV 12/20/2016, normal 07/21/17  Circumcision    Pediatrician    Support Person     Check LFT every trimester:  [ x] 1st  [ ]   2nd  [ ]  3rd         Obesity (BMI 30.0-34.9) 06/25/2017 by Farrel ConnersGutierrez, Colleen, CNM No       Gestational age appropriate obstetric precautions including but not limited to vaginal bleeding, contractions, leaking of fluid and fetal movement were reviewed in detail with the patient.    Patient has seen MFM and [facility]. RUQ US was normal. Plan for LFT testing every trimester, labs ordered for next visit.  Anatomy [place] at next visit Discussed birth control options, patient desires Nexplanon. Given handout.  Discussed [place] services at hospital, given hand out Discussed [facility] prenatal classes, given hand out Dysuria- UA today negative, no sign of  infection.  Return in about 1 month (around 09/11/2017) for anatomy US and ROB.   Adelene Idlerhristanna Efrata Brunner MD [facility], [facility] 08/14/2017, 2:39 PM

## 2017-08-14 NOTE — Patient Instructions (Signed)
Second Trimester of Pregnancy The second trimester is from week 13 through week 28, month 4 through 6. This is often the time in pregnancy that you feel your best. Often times, morning sickness has lessened or quit. You may have more energy, and you may get hungry more often. Your unborn baby (fetus) is growing rapidly. At the end of the sixth month, he or she is about 9 inches long and weighs about 1 pounds. You will likely feel the baby move (quickening) between 18 and 20 weeks of pregnancy. Follow these instructions at home:  Avoid all smoking, herbs, and alcohol. Avoid drugs not approved by your doctor.  Do not use any tobacco products, including cigarettes, chewing tobacco, and electronic cigarettes. If you need help quitting, ask your doctor. You may get counseling or other support to help you quit.  Only take medicine as told by your doctor. Some medicines are safe and some are not during pregnancy.  Exercise only as told by your doctor. Stop exercising if you start having cramps.  Eat regular, healthy meals.  Wear a good support bra if your breasts are tender.  Do not use hot tubs, steam rooms, or saunas.  Wear your seat belt when driving.  Avoid raw meat, uncooked cheese, and liter boxes and soil used by cats.  Take your prenatal vitamins.  Take 1500-2000 milligrams of calcium daily starting at the 20th week of pregnancy until you deliver your baby.  Try taking medicine that helps you poop (stool softener) as needed, and if your doctor approves. Eat more fiber by eating fresh fruit, vegetables, and whole grains. Drink enough fluids to keep your pee (urine) clear or pale yellow.  Take warm water baths (sitz baths) to soothe pain or discomfort caused by hemorrhoids. Use hemorrhoid cream if your doctor approves.  If you have puffy, bulging veins (varicose veins), wear support hose. Raise (elevate) your feet for 15 minutes, 3-4 times a day. Limit salt in your diet.  Avoid heavy  lifting, wear low heals, and sit up straight.  Rest with your legs raised if you have leg cramps or low back pain.  Visit your dentist if you have not gone during your pregnancy. Use a soft toothbrush to brush your teeth. Be gentle when you floss.  You can have sex (intercourse) unless your doctor tells you not to.  Go to your doctor visits. Get help if:  You feel dizzy.  You have mild cramps or pressure in your lower belly (abdomen).  You have a nagging pain in your belly area.  You continue to feel sick to your stomach (nauseous), throw up (vomit), or have watery poop (diarrhea).  You have bad smelling fluid coming from your vagina.  You have pain with peeing (urination). Get help right away if:  You have a fever.  You are leaking fluid from your vagina.  You have spotting or bleeding from your vagina.  You have severe belly cramping or pain.  You lose or gain weight rapidly.  You have trouble catching your breath and have chest pain.  You notice sudden or extreme puffiness (swelling) of your face, hands, ankles, feet, or legs.  You have not felt the baby move in over an hour.  You have severe headaches that do not go away with medicine.  You have vision changes. This information is not intended to replace advice given to you by your health care provider. Make sure you discuss any questions you have with your health care   provider. Document Released: 07/31/2009 Document Revised: 10/12/2015 Document Reviewed: 07/07/2012 Elsevier Interactive Patient Education  2017 Elsevier Inc.  

## 2017-08-28 DIAGNOSIS — O98419 Viral hepatitis complicating pregnancy, unspecified trimester: Secondary | ICD-10-CM

## 2017-08-28 DIAGNOSIS — B182 Chronic viral hepatitis C: Secondary | ICD-10-CM | POA: Insufficient documentation

## 2017-09-12 ENCOUNTER — Encounter: Payer: Self-pay | Admitting: Advanced Practice Midwife

## 2017-09-12 ENCOUNTER — Ambulatory Visit (INDEPENDENT_AMBULATORY_CARE_PROVIDER_SITE_OTHER): Payer: Medicaid Other

## 2017-09-12 ENCOUNTER — Ambulatory Visit (INDEPENDENT_AMBULATORY_CARE_PROVIDER_SITE_OTHER): Payer: Medicaid Other | Admitting: Advanced Practice Midwife

## 2017-09-12 VITALS — BP 108/62 | Wt 232.0 lb

## 2017-09-12 DIAGNOSIS — IMO0002 Reserved for concepts with insufficient information to code with codable children: Secondary | ICD-10-CM

## 2017-09-12 DIAGNOSIS — Z0489 Encounter for examination and observation for other specified reasons: Secondary | ICD-10-CM

## 2017-09-12 DIAGNOSIS — O0992 Supervision of high risk pregnancy, unspecified, second trimester: Secondary | ICD-10-CM

## 2017-09-12 DIAGNOSIS — O0990 Supervision of high risk pregnancy, unspecified, unspecified trimester: Secondary | ICD-10-CM

## 2017-09-12 DIAGNOSIS — Z3A18 18 weeks gestation of pregnancy: Secondary | ICD-10-CM

## 2017-09-12 NOTE — Progress Notes (Signed)
Anatomy scan today girl/incomplete. No c/o's.Unable to give urine specimen at this time.

## 2017-09-12 NOTE — Progress Notes (Signed)
Routine Prenatal Care Visit  Subjective  April Keller is a 27 y.o. G2P0010 at [redacted]w[redacted]d being seen today for ongoing prenatal care.  She is currently monitored for the following issues for this high-risk pregnancy and has Supervision of high risk pregnancy, antepartum; Elevated liver enzymes; Positive hepatitis C antibody test; History of intravenous drug use in remission; Substance abuse in remission (HCC); Low grade squamous intraepithelial lesion (LGSIL) on cervical Pap smear; History of abnormal cervical Pap smear; MRSA (methicillin resistant staph aureus) culture positive; Obesity (BMI 30.0-34.9); Pregnant state, gestational carrier; Chronic hepatitis C during pregnancy, antepartum (HCC); Generalized anxiety disorder; and History of substance abuse on their problem list.  ----------------------------------------------------------------------------------- Patient reports no complaints.   Contractions: Not present. Vag. Bleeding: None.  Movement: Absent. Denies leaking of fluid.  ----------------------------------------------------------------------------------- The following portions of the patient's history were reviewed and updated as appropriate: allergies, current medications, past family history, past medical history, past social history, past surgical history and problem list. Problem list updated.   Objective  Blood pressure 108/62, weight 232 lb (105.2 kg), last menstrual period 05/05/2017. Pregravid weight 216 lb (98 kg) Total Weight Gain 16 lb (7.258 kg) Urinalysis: Urine Protein: Negative Urine Glucose: Negative  Fetal Status: Fetal Heart Rate (bpm): 139   Movement: Absent     Anatomy scan today is incomplete for face, diaphragm, abdominal cord insertion  General:  Alert, oriented and cooperative. Patient is in no acute distress.  Skin: Skin is warm and dry. No rash noted.   Cardiovascular: Normal heart rate noted  Respiratory: Normal respiratory effort, no problems with  respiration noted  Abdomen: Soft, gravid, appropriate for gestational age. Pain/Pressure: Absent     Pelvic:  Cervical exam deferred        Extremities: Normal range of motion.  Edema: None  Mental Status: Normal mood and affect. Normal behavior. Normal judgment and thought content.   Assessment   27 y.o. G2P0010 at [redacted]w[redacted]d by  02/09/2018, by Last Menstrual Period presenting for routine prenatal visit  Plan   pregnancy #2 Problems (from 06/18/17 to present)    Problem Noted Resolved   Pregnant state, gestational carrier 07/10/2017 by Grotegut, [place], MD No   Supervision of high risk pregnancy, antepartum 06/25/2017 by Farrel Conners, CNM No   Overview Addendum 08/14/2017  2:38 PM by Natale Milch, MD     Clinic [facility] Prenatal Labs  Dating LMP c/w 6wk1d ultrasound Blood type: O/Positive/-- (01/30 1040)   Genetic Screen Declines Antibody:Negative (01/30 1040)  Anatomic [place]  Rubella: <0.90 (01/30 1040)/ Varicella Immune  GTT Early:  78             Third trimester:  RPR: Non Reactive (01/30 1040)   Flu vaccine  06/18/2017 HBsAg: Negative (01/30 1040)   TDaP vaccine                                               Rhogam: N/A HIV: Non Reactive (01/30 1040)   Baby Food  breast/bottle                                             GBS: (For PCN allergy, check sensitivities)  Contraception  nexplanon Pap: LGSIL PAP with +HRHPV 12/20/2016, normal 07/21/17  Circumcision  Pediatrician    Support Person     Check LFT every trimester:  [ x] 1st  [ ]  2nd  [ ]  3rd         Obesity (BMI 30.0-34.9) 06/25/2017 by Farrel ConnersGutierrez, Colleen, CNM No       Preterm labor symptoms and general obstetric precautions including but not limited to vaginal bleeding, contractions, leaking of fluid and fetal movement were reviewed in detail with the patient.   Return in about 1 month (around 10/10/2017) for f/u anatomy and rob.  Tresea MallJane Sejla Marzano, CNM 09/12/2017 4:07 PM

## 2017-10-10 ENCOUNTER — Encounter: Payer: Self-pay | Admitting: Obstetrics and Gynecology

## 2017-10-10 ENCOUNTER — Ambulatory Visit (INDEPENDENT_AMBULATORY_CARE_PROVIDER_SITE_OTHER): Payer: Medicaid Other | Admitting: Obstetrics and Gynecology

## 2017-10-10 ENCOUNTER — Ambulatory Visit (INDEPENDENT_AMBULATORY_CARE_PROVIDER_SITE_OTHER): Payer: Medicaid Other

## 2017-10-10 VITALS — BP 100/60 | Wt 239.0 lb

## 2017-10-10 DIAGNOSIS — O0990 Supervision of high risk pregnancy, unspecified, unspecified trimester: Secondary | ICD-10-CM

## 2017-10-10 DIAGNOSIS — R768 Other specified abnormal immunological findings in serum: Secondary | ICD-10-CM

## 2017-10-10 DIAGNOSIS — IMO0002 Reserved for concepts with insufficient information to code with codable children: Secondary | ICD-10-CM

## 2017-10-10 DIAGNOSIS — O4440 Low lying placenta NOS or without hemorrhage, unspecified trimester: Secondary | ICD-10-CM

## 2017-10-10 DIAGNOSIS — O4442 Low lying placenta NOS or without hemorrhage, second trimester: Secondary | ICD-10-CM | POA: Diagnosis not present

## 2017-10-10 DIAGNOSIS — Z3A22 22 weeks gestation of pregnancy: Secondary | ICD-10-CM | POA: Diagnosis not present

## 2017-10-10 DIAGNOSIS — O0992 Supervision of high risk pregnancy, unspecified, second trimester: Secondary | ICD-10-CM | POA: Diagnosis not present

## 2017-10-10 DIAGNOSIS — F1911 Other psychoactive substance abuse, in remission: Secondary | ICD-10-CM

## 2017-10-10 DIAGNOSIS — B182 Chronic viral hepatitis C: Secondary | ICD-10-CM

## 2017-10-10 DIAGNOSIS — Z333 Pregnant state, gestational carrier: Secondary | ICD-10-CM

## 2017-10-10 DIAGNOSIS — O98419 Viral hepatitis complicating pregnancy, unspecified trimester: Secondary | ICD-10-CM

## 2017-10-10 DIAGNOSIS — Z0489 Encounter for examination and observation for other specified reasons: Secondary | ICD-10-CM | POA: Diagnosis not present

## 2017-10-10 DIAGNOSIS — E669 Obesity, unspecified: Secondary | ICD-10-CM

## 2017-10-10 DIAGNOSIS — E66811 Obesity, class 1: Secondary | ICD-10-CM

## 2017-10-10 DIAGNOSIS — Z87898 Personal history of other specified conditions: Secondary | ICD-10-CM

## 2017-10-10 NOTE — Progress Notes (Signed)
Pt reports no problems. follow up anatomy scan today.

## 2017-10-10 NOTE — Progress Notes (Signed)
Routine Prenatal Care Visit  Subjective  April Keller is a 27 y.o. G2P0010 at [redacted]w[redacted]d being seen today for ongoing prenatal care.  She is currently monitored for the following issues for this high-risk pregnancy and has Supervision of high risk pregnancy, antepartum; Elevated liver enzymes; Positive hepatitis C antibody test; History of intravenous drug use in remission; Substance abuse in remission (HCC); Low grade squamous intraepithelial lesion (LGSIL) on cervical Pap smear; History of abnormal cervical Pap smear; MRSA (methicillin resistant staph aureus) culture positive; Obesity (BMI 30.0-34.9); Pregnant state, gestational carrier; Chronic hepatitis C during pregnancy, antepartum (HCC); Generalized anxiety disorder; and History of substance abuse on their problem list.  ----------------------------------------------------------------------------------- Patient reports vague pain in right rib cage. Difficulty sleeping because of pain. Denies Trauma.   Contractions: Not present. Vag. Bleeding: None.  Movement: Present. Denies leaking of fluid.  ----------------------------------------------------------------------------------- The following portions of the patient's history were reviewed and updated as appropriate: allergies, current medications, past family history, past medical history, past social history, past surgical history and problem list. Problem list updated.   Objective  Blood pressure 100/60, weight 239 lb (108.4 kg), last menstrual period 05/05/2017. Pregravid weight 216 lb (98 kg) Total Weight Gain 23 lb (10.4 kg) Urinalysis: Urine Protein: Negative Urine Glucose: Negative  Fetal Status: Fetal Heart Rate (bpm): 144 Fundal Height: 23 cm Movement: Present     General:  Alert, oriented and cooperative. Patient is in no acute distress.  Skin: Skin is warm and dry. No rash noted.   Cardiovascular: Normal heart rate noted  Respiratory: Normal respiratory effort, no problems with  respiration noted  Abdomen: Soft, gravid, appropriate for gestational age. Pain/Pressure: Absent     Pelvic:  Cervical exam deferred        Extremities: Normal range of motion.  Edema: Mild pitting, slight indentation  ental Status: Normal mood and affect. Normal behavior. Normal judgment and thought content.     Assessment   27 y.o. G2P0010 at [redacted]w[redacted]d by  02/09/2018, by Last Menstrual Period presenting for routine prenatal visit  Plan   pregnancy #2 Problems (from 06/18/17 to present)    Problem Noted Resolved   Pregnant state, gestational carrier 07/10/2017 by Grotegut, [place], MD No   Supervision of high risk pregnancy, antepartum 06/25/2017 by April Keller, CNM No   Overview Addendum 10/10/2017  3:52 PM by April Milch, MD     [facility]inic [facility] P[facility]atal [facility]  Dating LMP c/w 6wk1d ultrasound Blood type: O/Positive/-- (01/30 1040)   Genetic Screen Declines Antibody:Negative (01/30 1040)  Anatomic [place]  complete Rubella: <0.90 (01/30 1040)/ Varicella Immune  GTT Early:  78             Third trimester:  RPR: Non Reactive (01/30 1040)   Flu vaccine  06/18/2017 HBsAg: Negative (01/30 1040)   TDaP vaccine                                               Rhogam: N/A HIV: Non Reactive (01/30 1040)   Baby Food  breast/bottle                                             GBS: (For PCN allergy, check sensitivities)  Contraception  nexplanon Pap: LGSIL  PAP with +HRHPV 12/20/2016, normal 07/21/17  Circumcision    Pediatrician    Support Person     Check LFT every trimester:  [ x] 1st  April Keller ] 2nd   3rd         Obesity (BMI 30.0-34.9) 06/25/2017 by April Keller, CNM No       Gestational age appropriate obstetric precautions including but not limited to vaginal bleeding, contractions, leaking of fluid and fetal movement were reviewed in detail with the patient.    Low lying placenta, will need recheck at [redacted] week gestation. CMP today to check liver function for history of Hepatitis  C. Return in about 1 month (around 11/07/2017) for 28 week labs and ROB.  April Idler MD [facility], [facility] 10/11/17 11:47 AM

## 2017-10-11 DIAGNOSIS — O4440 Low lying placenta NOS or without hemorrhage, unspecified trimester: Secondary | ICD-10-CM | POA: Insufficient documentation

## 2017-10-11 LAB — COMPREHENSIVE METABOLIC PANEL
A/G RATIO: 1.2 (ref 1.2–2.2)
ALK PHOS: 118 IU/L — AB (ref 39–117)
ALT: 34 IU/L — AB (ref 0–32)
AST: 25 IU/L (ref 0–40)
Albumin: 3.5 g/dL (ref 3.5–5.5)
BUN / CREAT RATIO: 10 (ref 9–23)
BUN: 8 mg/dL (ref 6–20)
CHLORIDE: 107 mmol/L — AB (ref 96–106)
CO2: 19 mmol/L — ABNORMAL LOW (ref 20–29)
Calcium: 9 mg/dL (ref 8.7–10.2)
Creatinine, Ser: 0.79 mg/dL (ref 0.57–1.00)
GFR calc non Af Amer: 105 mL/min/{1.73_m2} (ref >59)
GFR, EST AFRICAN AMERICAN: 121 mL/min/{1.73_m2} (ref >59)
Globulin, Total: 3 g/dL (ref 1.5–4.5)
Glucose: 77 mg/dL (ref 65–99)
POTASSIUM: 4 mmol/L (ref 3.5–5.2)
Sodium: 138 mmol/L (ref 134–144)
Total Protein: 6.5 g/dL (ref 6.0–8.5)

## 2017-10-11 LAB — URINE DRUG PANEL 7
Amphetamines, Urine: NEGATIVE ng/mL
BENZODIAZEPINE QUANT UR: NEGATIVE ng/mL
Barbiturate Quant, Ur: NEGATIVE ng/mL
CANNABINOID QUANT UR: NEGATIVE ng/mL
COCAINE (METAB.): NEGATIVE ng/mL
Opiate Quant, Ur: NEGATIVE ng/mL
PCP QUANT UR: NEGATIVE ng/mL

## 2017-10-15 NOTE — Progress Notes (Signed)
Released to mychart

## 2017-10-29 ENCOUNTER — Other Ambulatory Visit: Payer: Self-pay

## 2017-10-29 ENCOUNTER — Observation Stay
Admission: EM | Admit: 2017-10-29 | Discharge: 2017-10-29 | Disposition: A | Discharge status: Home / Self Care | Payer: Medicaid Other | Attending: Obstetrics and Gynecology | Admitting: Obstetrics and Gynecology

## 2017-10-29 ENCOUNTER — Telehealth: Payer: Self-pay

## 2017-10-29 DIAGNOSIS — Z91040 Latex allergy status: Secondary | ICD-10-CM | POA: Insufficient documentation

## 2017-10-29 DIAGNOSIS — O368120 Decreased fetal movements, second trimester, not applicable or unspecified: Principal | ICD-10-CM | POA: Insufficient documentation

## 2017-10-29 DIAGNOSIS — O99332 Smoking (tobacco) complicating pregnancy, second trimester: Secondary | ICD-10-CM | POA: Insufficient documentation

## 2017-10-29 DIAGNOSIS — Z3A25 25 weeks gestation of pregnancy: Secondary | ICD-10-CM | POA: Diagnosis not present

## 2017-10-29 DIAGNOSIS — O2342 Unspecified infection of urinary tract in pregnancy, second trimester: Secondary | ICD-10-CM | POA: Diagnosis not present

## 2017-10-29 DIAGNOSIS — O26892 Other specified pregnancy related conditions, second trimester: Secondary | ICD-10-CM | POA: Diagnosis not present

## 2017-10-29 DIAGNOSIS — R197 Diarrhea, unspecified: Secondary | ICD-10-CM | POA: Insufficient documentation

## 2017-10-29 DIAGNOSIS — F17290 Nicotine dependence, other tobacco product, uncomplicated: Secondary | ICD-10-CM | POA: Diagnosis not present

## 2017-10-29 LAB — URINALYSIS, COMPLETE (UACMP) WITH MICROSCOPIC
Bilirubin Urine: NEGATIVE
GLUCOSE, UA: NEGATIVE mg/dL
HGB URINE DIPSTICK: NEGATIVE
Ketones, ur: NEGATIVE mg/dL
Nitrite: NEGATIVE
PROTEIN: NEGATIVE mg/dL
SPECIFIC GRAVITY, URINE: 1.008 (ref 1.005–1.030)
pH: 8 (ref 5.0–8.0)

## 2017-10-29 MED ORDER — ACETAMINOPHEN 325 MG PO TABS
650.0000 mg | ORAL_TABLET | ORAL | Status: DC | PRN
  Administered 2017-10-29: 650 mg via ORAL
  Filled 2017-10-29: qty 2

## 2017-10-29 MED ORDER — CEPHALEXIN 500 MG PO CAPS: 500.0000 mg | ORAL_CAPSULE | Freq: Two times a day (BID) | ORAL | 0 refills | Status: AC

## 2017-10-29 NOTE — Telephone Encounter (Signed)
Pt reports she has been experiencing really bad light headed, back pain, diarrhea & dizziness even when she sits up in the bed late at night. Next apt 11/07/17 w/[facility]. ZO#[phone]

## 2017-10-29 NOTE — Final Progress Note (Signed)
Physician Final Progress Note  Patient ID: April Keller MRN: [id number] DOB/AGE: 11-Feb-1991 26 y.o.  Admit date: 10/29/2017 Admitting provider: Vena Austria, MD Discharge date: 10/29/2017   Admission Diagnoses:  Indication for care in labor and delivery, antepartum  Discharge Diagnoses:  Active Problems:   * No active hospital problems. *  History of Present Illness: The patient is a 27 y.o. female G2P0010 at [redacted]w[redacted]d who presents for decreased fetal movement and diarrhea since Sunday evening (6/9). She has had some pain in her lower back that she rates 5/10. She says that she has not felt her baby move this morning. She denies abdominal pain and vomiting. She has not had contractions, loss of fluid, or vaginal bleeding.  Past Medical History:  Diagnosis Date  . Hepatitis C   . History of abnormal cervical Pap smear    since 2016  . History of abnormal cervical Pap smear    since 12/28/2014  . HPV (human papilloma virus) infection   . Low grade squamous intraepithelial lesion (LGSIL) on cervical Pap smear 12/30/2016   with positive HRHPV  . Migraine   . MRSA (methicillin resistant staph aureus) culture positive 01/2016   facial cellulitis  . 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 COLONOSCOPY  2015/2016?   abdominal pain  . COLPOSCOPY  2017   B9 biopsies  . TONSILLECTOMY      No current facility-administered medications on file prior to encounter.    Current Outpatient Medications on File Prior to Encounter  Medication Sig Dispense Refill  . Prenatal Vit-Fe Fumarate-FA (MULTIVITAMIN-PRENATAL) 27-0.8 MG TABS tablet Take 1 tablet by mouth daily at 12 noon.      Allergies  Allergen Reactions  . Latex Itching and Rash    Social History   Socioeconomic History  . Marital status: Married    Spouse name: Francesco Runner  . Number of children: Not on file  . Years of  education: Not on file  . Highest education level: GED or equivalent  Occupational History  . Not on file  Social Needs  . Financial resource strain: Not on file  . Food insecurity:    Worry: Not on file    Inability: Not on file  . Transportation needs:    Medical: Not on file    Non-medical: Not on file  Tobacco Use  . Smoking status: Current Every Day Smoker    Types: E-cigarettes  . Smokeless tobacco: Never Used  . Tobacco comment: Former smoker  Substance and Sexual Activity  . Alcohol use: No  . Drug use: No    Comment: Past user of heroin, cocaine, MJ, meth-ast used Jan 2017  . Sexual activity: Yes    Partners: Male    Birth control/protection: None  Lifestyle  . Physical activity:    Days per week: Not on file    Minutes per session: Not on file  . Stress: Not on file  Relationships  . Social connections:    Talks on phone: Not on file    Gets together: Not on file    Attends religious service: Not on file    Active member of club or organization: Not on file    Attends meetings of clubs or organizations: Not on file    Relationship status: Not on file  . Intimate partner violence:    Fear of current or ex partner: Not on  file    Emotionally abused: Not on file    Physically abused: Not on file    Forced sexual activity: Not on file  Other Topics Concern  . Not on file  Social History Narrative  . Not on file    Physical Exam: BP 118/66 (BP Location: Left Arm)   Temp 98.4 F (36.9 C) (Oral)   Resp 18   Wt 239 lb (108.4 kg)   LMP 05/05/2017 (Exact Date)   BMI 35.81 kg/m   Gen: NAD Pulm: No increased work of breathing Abdomen: gravid, non-tender Pelvic: deferred Ext: no signs of DVT  EFM: Baseline 135 bpm, moderate variability, accelerations present, occasional variable deceleration.  Consults: None  Significant Findings/ Diagnostic Studies: labs: UA with moderate leukocytes and many bacteria. Urine culture pending. Urine specific gravity  1.008  Discharge Condition: good  Disposition: Discharge disposition: 01-Home or Self Care       Diet: Regular diet  Discharge Activity: Activity as tolerated  Discharge Instructions    Discharge activity:  No Restrictions   Complete by:  As directed    Discharge diet:  No restrictions   Complete by:  As directed    No sexual activity restrictions   Complete by:  As directed    Notify physician for a general feeling that "something is not right"   Complete by:  As directed    Notify physician for increase or change in vaginal discharge   Complete by:  As directed    Notify physician for intestinal cramps, with or without diarrhea, sometimes described as "gas pain"   Complete by:  As directed    Notify physician for leaking of fluid   Complete by:  As directed    Notify physician for low, dull backache, unrelieved by heat or Tylenol   Complete by:  As directed    Notify physician for menstrual like cramps   Complete by:  As directed    Notify physician for pelvic pressure   Complete by:  As directed    Notify physician for uterine contractions.  These may be painless and feel like the uterus is tightening or the baby is  "balling up"   Complete by:  As directed    Notify physician for vaginal bleeding   Complete by:  As directed    PRETERM LABOR:  Includes any of the follwing symptoms that occur between 20 - [redacted] weeks gestation.  If these symptoms are not stopped, preterm labor can result in preterm delivery, placing your baby at risk   Complete by:  As directed      Allergies as of 10/29/2017      Reactions   Latex Itching, Rash      Medication List    TAKE these medications   cephALEXin 500 MG capsule Commonly known as:  KEFLEX Take 1 capsule (500 mg total) by mouth 2 (two) times daily for 7 days.   multivitamin-prenatal 27-0.8 MG Tabs tablet Take 1 tablet by mouth daily at 12 noon.      Good fetal movement in triage and EFM reassuring. Able to tolerate  oral fluids here without difficulty, and urine specific gravity is not consistent with dehydration. Sent Rx for Keflex to treat UTI; will notify patient if urine culture results require a change in therapy. Diarrhea most likely due to GI virus; advised that she can take OTC Immodium.  Signed: Oswaldo ConroyJacelyn Y , CNM  10/29/2017, 2:50 PM

## 2017-10-29 NOTE — Telephone Encounter (Signed)
Spoke w/pt. She has had diarrhea for 3-4 days now. States she can't even urinate w/o watery diarrhea coming out. Feels like she's going to pass out every time she moves/stands up. Reports decreased fetal movement. Pt advised to report to ER for eval/monitoring as she may need fluids.

## 2017-10-29 NOTE — Discharge Summary (Signed)
See Final Progress Note 10/29/2017. 

## 2017-10-31 LAB — URINE CULTURE

## 2017-11-03 ENCOUNTER — Telehealth: Payer: Self-pay

## 2017-11-03 ENCOUNTER — Other Ambulatory Visit: Payer: Self-pay | Admitting: Maternal Newborn

## 2017-11-03 DIAGNOSIS — B3731 Acute candidiasis of vulva and vagina: Secondary | ICD-10-CM

## 2017-11-03 DIAGNOSIS — B373 Candidiasis of vulva and vagina: Secondary | ICD-10-CM

## 2017-11-03 MED ORDER — TERCONAZOLE 0.4 % VA CREA: 1.0000 | TOPICAL_CREAM | Freq: Every day | VAGINAL | 0 refills | Status: AC

## 2017-11-03 NOTE — Progress Notes (Signed)
Rx sent for terconazole. 

## 2017-11-03 NOTE — Telephone Encounter (Signed)
Pt reports being seen at [facility] Thursday 10/29/17 & was treated for UTI. She is now having s&s of yeast infection. She would like a rx for yi. [id number]#[phone]#[phone]

## 2017-11-03 NOTE — Telephone Encounter (Signed)
I think you saw her on Thursday not sure if you want her to come in and be evluate

## 2017-11-07 ENCOUNTER — Other Ambulatory Visit: Payer: Self-pay

## 2017-11-07 ENCOUNTER — Other Ambulatory Visit: Payer: Medicaid Other

## 2017-11-07 ENCOUNTER — Ambulatory Visit (INDEPENDENT_AMBULATORY_CARE_PROVIDER_SITE_OTHER): Payer: Medicaid Other | Admitting: Obstetrics & Gynecology

## 2017-11-07 ENCOUNTER — Other Ambulatory Visit: Payer: Self-pay | Admitting: Obstetrics & Gynecology

## 2017-11-07 VITALS — BP 120/60 | Wt 246.0 lb

## 2017-11-07 DIAGNOSIS — O98419 Viral hepatitis complicating pregnancy, unspecified trimester: Secondary | ICD-10-CM

## 2017-11-07 DIAGNOSIS — O0990 Supervision of high risk pregnancy, unspecified, unspecified trimester: Secondary | ICD-10-CM

## 2017-11-07 DIAGNOSIS — B182 Chronic viral hepatitis C: Secondary | ICD-10-CM

## 2017-11-07 DIAGNOSIS — Z3A26 26 weeks gestation of pregnancy: Secondary | ICD-10-CM

## 2017-11-07 NOTE — Patient Instructions (Signed)

## 2017-11-07 NOTE — Progress Notes (Signed)
  Subjective  Fetal Movement? yes Contractions? no Leaking Fluid? no Vaginal Bleeding? no  Objective  BP 120/60   Wt 246 lb (111.6 kg)   LMP 05/05/2017 (Exact Date)   BMI 36.86 kg/m  General: NAD Pumonary: no increased work of breathing Abdomen: gravid, non-tender Extremities: no edema Psychiatric: mood appropriate, affect full  Assessment  27 y.o. G2P0010 at 832w4d by  02/09/2018, by Last Menstrual Period presenting for routine prenatal visit  Plan   Problem List Items Addressed This Visit      Digestive   Chronic hepatitis C during pregnancy, antepartum (HCC)     Other   Supervision of high risk pregnancy, antepartum    Other Visit Diagnoses    [redacted] weeks gestation of pregnancy    -  Primary   Relevant Orders   Hepatic function panel    Labs today Improved form UTI last week  Annamarie MajorPaul Jovani Flury, MD, Merlinda FrederickFACOG [facility], [facility] 11/07/2017  3:05 PM

## 2017-11-08 LAB — 28 WEEK RH+PANEL
BASOS ABS: 0 10*3/uL (ref 0.0–0.2)
Basos: 0 %
EOS (ABSOLUTE): 0.1 10*3/uL (ref 0.0–0.4)
Eos: 1 %
GESTATIONAL DIABETES SCREEN: 109 mg/dL (ref 65–139)
HIV SCREEN 4TH GENERATION: NONREACTIVE
Hematocrit: 36.5 % (ref 34.0–46.6)
Hemoglobin: 12 g/dL (ref 11.1–15.9)
IMMATURE GRANS (ABS): 0.1 10*3/uL (ref 0.0–0.1)
Immature Granulocytes: 1 %
LYMPHS: 13 %
Lymphocytes Absolute: 1.8 10*3/uL (ref 0.7–3.1)
MCH: 29.3 pg (ref 26.6–33.0)
MCHC: 32.9 g/dL (ref 31.5–35.7)
MCV: 89 fL (ref 79–97)
MONOCYTES: 3 %
Monocytes Absolute: 0.4 10*3/uL (ref 0.1–0.9)
NEUTROS PCT: 82 %
Neutrophils Absolute: 11.3 10*3/uL — ABNORMAL HIGH (ref 1.4–7.0)
PLATELETS: 216 10*3/uL (ref 150–450)
RBC: 4.09 x10E6/uL (ref 3.77–5.28)
RDW: 12.6 % (ref 12.3–15.4)
RPR Ser Ql: NONREACTIVE
WBC: 13.8 10*3/uL — ABNORMAL HIGH (ref 3.4–10.8)

## 2017-11-08 LAB — HEPATIC FUNCTION PANEL
ALBUMIN: 3.5 g/dL (ref 3.5–5.5)
ALK PHOS: 139 IU/L — AB (ref 39–117)
ALT: 31 IU/L (ref 0–32)
AST: 22 IU/L (ref 0–40)
BILIRUBIN, DIRECT: 0.06 mg/dL (ref 0.00–0.40)
Bilirubin Total: 0.2 mg/dL (ref 0.0–1.2)
TOTAL PROTEIN: 6.3 g/dL (ref 6.0–8.5)

## 2017-11-28 ENCOUNTER — Ambulatory Visit (INDEPENDENT_AMBULATORY_CARE_PROVIDER_SITE_OTHER): Payer: Medicaid Other | Admitting: Obstetrics & Gynecology

## 2017-11-28 VITALS — BP 110/64 | Wt 252.0 lb

## 2017-11-28 DIAGNOSIS — O98419 Viral hepatitis complicating pregnancy, unspecified trimester: Secondary | ICD-10-CM

## 2017-11-28 DIAGNOSIS — O98413 Viral hepatitis complicating pregnancy, third trimester: Secondary | ICD-10-CM

## 2017-11-28 DIAGNOSIS — Z3A29 29 weeks gestation of pregnancy: Secondary | ICD-10-CM | POA: Diagnosis not present

## 2017-11-28 DIAGNOSIS — Z23 Encounter for immunization: Secondary | ICD-10-CM | POA: Diagnosis not present

## 2017-11-28 DIAGNOSIS — F1911 Other psychoactive substance abuse, in remission: Secondary | ICD-10-CM

## 2017-11-28 DIAGNOSIS — O0990 Supervision of high risk pregnancy, unspecified, unspecified trimester: Secondary | ICD-10-CM

## 2017-11-28 DIAGNOSIS — B182 Chronic viral hepatitis C: Secondary | ICD-10-CM

## 2017-11-28 DIAGNOSIS — O99323 Drug use complicating pregnancy, third trimester: Secondary | ICD-10-CM

## 2017-11-28 NOTE — Patient Instructions (Signed)
Third Trimester of Pregnancy The third trimester is from week 28 through week 40 (months 7 through 9). The third trimester is a time when the unborn baby (fetus) is growing rapidly. At the end of the ninth month, the fetus is about 20 inches in length and weighs 6-10 pounds. Body changes during your third trimester Your body will continue to go through many changes during pregnancy. The changes vary from woman to woman. During the third trimester:  Your weight will continue to increase. You can expect to gain 25-35 pounds (11-16 kg) by the end of the pregnancy.  You may begin to get stretch marks on your hips, abdomen, and breasts.  You may urinate more often because the fetus is moving lower into your pelvis and pressing on your bladder.  You may develop or continue to have heartburn. This is caused by increased hormones that slow down muscles in the digestive tract.  You may develop or continue to have constipation because increased hormones slow digestion and cause the muscles that push waste through your intestines to relax.  You may develop hemorrhoids. These are swollen veins (varicose veins) in the rectum that can itch or be painful.  You may develop swollen, bulging veins (varicose veins) in your legs.  You may have increased body aches in the pelvis, back, or thighs. This is due to weight gain and increased hormones that are relaxing your joints.  You may have changes in your hair. These can include thickening of your hair, rapid growth, and changes in texture. Some women also have hair loss during or after pregnancy, or hair that feels dry or thin. Your hair will most likely return to normal after your baby is born.  Your breasts will continue to grow and they will continue to become tender. A yellow fluid (colostrum) may leak from your breasts. This is the first milk you are producing for your baby.  Your belly button may stick out.  You may notice more swelling in your hands,  face, or ankles.  You may have increased tingling or numbness in your hands, arms, and legs. The skin on your belly may also feel numb.  You may feel short of breath because of your expanding uterus.  You may have more problems sleeping. This can be caused by the size of your belly, increased need to urinate, and an increase in your body's metabolism.  You may notice the fetus "dropping," or moving lower in your abdomen (lightening).  You may have increased vaginal discharge.  You may notice your joints feel loose and you may have pain around your pelvic bone.  What to expect at prenatal visits You will have prenatal exams every 2 weeks until week 36. Then you will have weekly prenatal exams. During a routine prenatal visit:  You will be weighed to make sure you and the baby are growing normally.  Your blood pressure will be taken.  Your abdomen will be measured to track your baby's growth.  The fetal heartbeat will be listened to.  Any test results from the previous visit will be discussed.  You may have a cervical check near your due date to see if your cervix has softened or thinned (effaced).  You will be tested for Group B streptococcus. This happens between 35 and 37 weeks.  Your health care provider may ask you:  What your birth plan is.  How you are feeling.  If you are feeling the baby move.  If you have had   any abnormal symptoms, such as leaking fluid, bleeding, severe headaches, or abdominal cramping.  If you are using any tobacco products, including cigarettes, chewing tobacco, and electronic cigarettes.  If you have any questions.  Other tests or screenings that may be performed during your third trimester include:  Blood tests that check for low iron levels (anemia).  Fetal testing to check the health, activity level, and growth of the fetus. Testing is done if you have certain medical conditions or if there are problems during the  pregnancy.  Nonstress test (NST). This test checks the health of your baby to make sure there are no signs of problems, such as the baby not getting enough oxygen. During this test, a belt is placed around your belly. The baby is made to move, and its heart rate is monitored during movement.  What is false labor? False labor is a condition in which you feel small, irregular tightenings of the muscles in the womb (contractions) that usually go away with rest, changing position, or drinking water. These are called Braxton Hicks contractions. Contractions may last for hours, days, or even weeks before true labor sets in. If contractions come at regular intervals, become more frequent, increase in intensity, or become painful, you should see your health care provider. What are the signs of labor?  Abdominal cramps.  Regular contractions that start at 10 minutes apart and become stronger and more frequent with time.  Contractions that start on the top of the uterus and spread down to the lower abdomen and back.  Increased pelvic pressure and dull back pain.  A watery or bloody mucus discharge that comes from the vagina.  Leaking of amniotic fluid. This is also known as your "water breaking." It could be a slow trickle or a gush. Let your health care provider know if it has a color or strange odor. If you have any of these signs, call your health care provider right away, even if it is before your due date. Follow these instructions at home: Medicines  Follow your health care provider's instructions regarding medicine use. Specific medicines may be either safe or unsafe to take during pregnancy.  Take a prenatal vitamin that contains at least 600 micrograms (mcg) of folic acid.  If you develop constipation, try taking a stool softener if your health care provider approves. Eating and drinking  Eat a balanced diet that includes fresh fruits and vegetables, whole grains, good sources of protein  such as meat, eggs, or tofu, and low-fat dairy. Your health care provider will help you determine the amount of weight gain that is right for you.  Avoid raw meat and uncooked cheese. These carry germs that can cause birth defects in the baby.  If you have low calcium intake from food, talk to your health care provider about whether you should take a daily calcium supplement.  Eat four or five small meals rather than three large meals a day.  Limit foods that are high in fat and processed sugars, such as fried and sweet foods.  To prevent constipation: ? Drink enough fluid to keep your urine clear or pale yellow. ? Eat foods that are high in fiber, such as fresh fruits and vegetables, whole grains, and beans. Activity  Exercise only as directed by your health care provider. Most women can continue their usual exercise routine during pregnancy. Try to exercise for 30 minutes at least 5 days a week. Stop exercising if you experience uterine contractions.  Avoid heavy   lifting.  Do not exercise in extreme heat or humidity, or at high altitudes.  Wear low-heel, comfortable shoes.  Practice good posture.  You may continue to have sex unless your health care provider tells you otherwise. Relieving pain and discomfort  Take frequent breaks and rest with your legs elevated if you have leg cramps or low back pain.  Take warm sitz baths to soothe any pain or discomfort caused by hemorrhoids. Use hemorrhoid cream if your health care provider approves.  Wear a good support bra to prevent discomfort from breast tenderness.  If you develop varicose veins: ? Wear support pantyhose or compression stockings as told by your healthcare provider. ? Elevate your feet for 15 minutes, 3-4 times a day. Prenatal care  Write down your questions. Take them to your prenatal visits.  Keep all your prenatal visits as told by your health care provider. This is important. Safety  Wear your seat belt at  all times when driving.  Make a list of emergency phone numbers, including numbers for family, friends, the hospital, and police and fire departments. General instructions  Avoid cat litter boxes and soil used by cats. These carry germs that can cause birth defects in the baby. If you have a cat, ask someone to clean the litter box for you.  Do not travel far distances unless it is absolutely necessary and only with the approval of your health care provider.  Do not use hot tubs, steam rooms, or saunas.  Do not drink alcohol.  Do not use any products that contain nicotine or tobacco, such as cigarettes and e-cigarettes. If you need help quitting, ask your health care provider.  Do not use any medicinal herbs or unprescribed drugs. These chemicals affect the formation and growth of the baby.  Do not douche or use tampons or scented sanitary pads.  Do not cross your legs for long periods of time.  To prepare for the arrival of your baby: ? Take prenatal classes to understand, practice, and ask questions about labor and delivery. ? Make a trial run to the hospital. ? Visit the hospital and tour the maternity area. ? Arrange for maternity or paternity leave through employers. ? Arrange for family and friends to take care of pets while you are in the hospital. ? Purchase a rear-facing car seat and make sure you know how to install it in your car. ? Pack your hospital bag. ? Prepare the baby's nursery. Make sure to remove all pillows and stuffed animals from the baby's crib to prevent suffocation.  Visit your dentist if you have not gone during your pregnancy. Use a soft toothbrush to brush your teeth and be gentle when you floss. Contact a health care provider if:  You are unsure if you are in labor or if your water has broken.  You become dizzy.  You have mild pelvic cramps, pelvic pressure, or nagging pain in your abdominal area.  You have lower back pain.  You have persistent  nausea, vomiting, or diarrhea.  You have an unusual or bad smelling vaginal discharge.  You have pain when you urinate. Get help right away if:  Your water breaks before 37 weeks.  You have regular contractions less than 5 minutes apart before 37 weeks.  You have a fever.  You are leaking fluid from your vagina.  You have spotting or bleeding from your vagina.  You have severe abdominal pain or cramping.  You have rapid weight loss or weight gain.    You have shortness of breath with chest pain.  You notice sudden or extreme swelling of your face, hands, ankles, feet, or legs.  Your baby makes fewer than 10 movements in 2 hours.  You have severe headaches that do not go away when you take medicine.  You have vision changes. Summary  The third trimester is from week 28 through week 40, months 7 through 9. The third trimester is a time when the unborn baby (fetus) is growing rapidly.  During the third trimester, your discomfort may increase as you and your baby continue to gain weight. You may have abdominal, leg, and back pain, sleeping problems, and an increased need to urinate.  During the third trimester your breasts will keep growing and they will continue to become tender. A yellow fluid (colostrum) may leak from your breasts. This is the first milk you are producing for your baby.  False labor is a condition in which you feel small, irregular tightenings of the muscles in the womb (contractions) that eventually go away. These are called Braxton Hicks contractions. Contractions may last for hours, days, or even weeks before true labor sets in.  Signs of labor can include: abdominal cramps; regular contractions that start at 10 minutes apart and become stronger and more frequent with time; watery or bloody mucus discharge that comes from the vagina; increased pelvic pressure and dull back pain; and leaking of amniotic fluid. This information is not intended to replace advice  given to you by your health care provider. Make sure you discuss any questions you have with your health care provider. Document Released: 04/30/2001 Document Revised: 10/12/2015 Document Reviewed: 07/07/2012 Elsevier Interactive Patient Education  2017 [employer]  

## 2017-11-28 NOTE — Progress Notes (Signed)
  Subjective  Fetal Movement? yes Contractions? no Leaking Fluid? no Vaginal Bleeding? No Edema nightly  Objective  BP 110/64   Wt 252 lb (114.3 kg)   LMP 05/05/2017 (Exact Date)   BMI 37.76 kg/m  General: NAD Pumonary: no increased work of breathing Abdomen: gravid, non-tender Extremities: no edema Psychiatric: mood appropriate, affect full  Assessment  27 y.o. G2P0010 at 1262w4d by  02/09/2018, by Last Menstrual Period presenting for routine prenatal visit  Plan   Problem List Items Addressed This Visit      Digestive   Chronic hepatitis C during pregnancy, antepartum (HCC)     Other   Supervision of high risk pregnancy, antepartum   Substance abuse in remission [facility](HCC)    Other Visit Diagnoses    [redacted] weeks gestation of pregnancy    -  Primary    TDaP today  Annamarie MajorPaul Jarrell Armond, MD, Merlinda FrederickFACOG [facility], [facility] 11/28/2017  4:43 PM

## 2017-12-12 ENCOUNTER — Ambulatory Visit (INDEPENDENT_AMBULATORY_CARE_PROVIDER_SITE_OTHER): Payer: Medicaid Other | Admitting: Obstetrics & Gynecology

## 2017-12-12 VITALS — BP 116/60 | Wt 258.0 lb

## 2017-12-12 DIAGNOSIS — B182 Chronic viral hepatitis C: Secondary | ICD-10-CM

## 2017-12-12 DIAGNOSIS — F1911 Other psychoactive substance abuse, in remission: Secondary | ICD-10-CM

## 2017-12-12 DIAGNOSIS — O98419 Viral hepatitis complicating pregnancy, unspecified trimester: Secondary | ICD-10-CM

## 2017-12-12 DIAGNOSIS — O99323 Drug use complicating pregnancy, third trimester: Secondary | ICD-10-CM

## 2017-12-12 DIAGNOSIS — O0990 Supervision of high risk pregnancy, unspecified, unspecified trimester: Secondary | ICD-10-CM

## 2017-12-12 DIAGNOSIS — O98413 Viral hepatitis complicating pregnancy, third trimester: Secondary | ICD-10-CM

## 2017-12-12 DIAGNOSIS — Z3A31 31 weeks gestation of pregnancy: Secondary | ICD-10-CM

## 2017-12-12 NOTE — Patient Instructions (Signed)
Braxton Hicks Contractions °Contractions of the uterus can occur throughout pregnancy, but they are not always a sign that you are in labor. You may have practice contractions called Braxton Hicks contractions. These false labor contractions are sometimes confused with true labor. °What are Braxton Hicks contractions? °Braxton Hicks contractions are tightening movements that occur in the muscles of the uterus before labor. Unlike true labor contractions, these contractions do not result in opening (dilation) and thinning of the cervix. Toward the end of pregnancy (32-34 weeks), Braxton Hicks contractions can happen more often and may become stronger. These contractions are sometimes difficult to tell apart from true labor because they can be very uncomfortable. You should not feel embarrassed if you go to the hospital with false labor. °Sometimes, the only way to tell if you are in true labor is for your health care provider to look for changes in the cervix. The health care provider will do a physical exam and may monitor your contractions. If you are not in true labor, the exam should show that your cervix is not dilating and your water has not broken. °If there are other health problems associated with your pregnancy, it is completely safe for you to be sent home with false labor. You may continue to have Braxton Hicks contractions until you go into true labor. °How to tell the difference between true labor and false labor °True labor °· Contractions last 30-70 seconds. °· Contractions become very regular. °· Discomfort is usually felt in the top of the uterus, and it spreads to the lower abdomen and low back. °· Contractions do not go away with walking. °· Contractions usually become more intense and increase in frequency. °· The cervix dilates and gets thinner. °False labor °· Contractions are usually shorter and not as strong as true labor contractions. °· Contractions are usually irregular. °· Contractions  are often felt in the front of the lower abdomen and in the groin. °· Contractions may go away when you walk around or change positions while lying down. °· Contractions get weaker and are shorter-lasting as time goes on. °· The cervix usually does not dilate or become thin. °Follow these instructions at home: °· Take over-the-counter and prescription medicines only as told by your health care provider. °· Keep up with your usual exercises and follow other instructions from your health care provider. °· Eat and drink lightly if you think you are going into labor. °· If Braxton Hicks contractions are making you uncomfortable: °? Change your position from lying down or resting to walking, or change from walking to resting. °? Sit and rest in a tub of warm water. °? Drink enough fluid to keep your urine pale yellow. Dehydration may cause these contractions. °? Do slow and deep breathing several times an hour. °· Keep all follow-up prenatal visits as told by your health care provider. This is important. °Contact a health care provider if: °· You have a fever. °· You have continuous pain in your abdomen. °Get help right away if: °· Your contractions become stronger, more regular, and closer together. °· You have fluid leaking or gushing from your vagina. °· You pass blood-tinged mucus (bloody show). °· You have bleeding from your vagina. °· You have low back pain that you never had before. °· You feel your baby’s head pushing down and causing pelvic pressure. °· Your baby is not moving inside you as much as it used to. °Summary °· Contractions that occur before labor are called Braxton   Hicks contractions, false labor, or practice contractions. °· Braxton Hicks contractions are usually shorter, weaker, farther apart, and less regular than true labor contractions. True labor contractions usually become progressively stronger and regular and they become more frequent. °· Manage discomfort from Braxton Hicks contractions by  changing position, resting in a warm bath, drinking plenty of water, or practicing deep breathing. °This information is not intended to replace advice given to you by your health care provider. Make sure you discuss any questions you have with your health care provider. °Document Released: 09/19/2016 Document Revised: 09/19/2016 Document Reviewed: 09/19/2016 °Elsevier Interactive Patient Education © 2018 [employer] ° °

## 2017-12-12 NOTE — Progress Notes (Signed)
  Subjective  Fetal Movement? yes Contractions? no Leaking Fluid? no Vaginal Bleeding? no  Objective  BP 116/60   Wt 258 lb (117 kg)   LMP 05/05/2017 (Exact Date)   BMI 38.66 kg/m  General: NAD Pumonary: no increased work of breathing Abdomen: gravid, non-tender Extremities: no edema Psychiatric: mood appropriate, affect full  Assessment  27 y.o. G2P0010 at 4219w4d by  02/09/2018, by Last Menstrual Period presenting for routine prenatal visit  Plan   Problem List Items Addressed This Visit      Digestive   Chronic hepatitis C during pregnancy, antepartum (HCC)     Other   Supervision of high risk pregnancy, antepartum   Substance abuse in remission [facility](HCC)    Other Visit Diagnoses    [redacted] weeks gestation of pregnancy    -  Primary      Annamarie MajorPaul Federick Levene, MD, Merlinda FrederickFACOG [facility], [facility] 12/12/2017  4:53 PM

## 2017-12-26 ENCOUNTER — Ambulatory Visit (INDEPENDENT_AMBULATORY_CARE_PROVIDER_SITE_OTHER): Payer: Medicaid Other | Admitting: Obstetrics and Gynecology

## 2017-12-26 ENCOUNTER — Encounter: Payer: Self-pay | Admitting: Obstetrics and Gynecology

## 2017-12-26 VITALS — BP 120/70 | Wt 261.0 lb

## 2017-12-26 DIAGNOSIS — O4443 Low lying placenta NOS or without hemorrhage, third trimester: Secondary | ICD-10-CM

## 2017-12-26 DIAGNOSIS — O0990 Supervision of high risk pregnancy, unspecified, unspecified trimester: Secondary | ICD-10-CM

## 2017-12-26 DIAGNOSIS — Z3A33 33 weeks gestation of pregnancy: Secondary | ICD-10-CM

## 2017-12-26 LAB — POCT URINALYSIS DIPSTICK OB
Glucose, UA: NEGATIVE — AB
PROTEIN: NEGATIVE

## 2017-12-26 NOTE — Progress Notes (Signed)
Routine Prenatal Care Visit  Subjective  April Keller is a 27 y.o. G2P0010 at [redacted]w[redacted]d being seen today for ongoing prenatal care.  She is currently monitored for the following issues for this high-risk pregnancy and has Supervision of high risk pregnancy, antepartum; Elevated liver enzymes; Positive hepatitis C antibody test; History of intravenous drug use in remission; Substance abuse in remission (HCC); Low grade squamous intraepithelial lesion (LGSIL) on cervical Pap smear; History of abnormal cervical Pap smear; MRSA (methicillin resistant staph aureus) culture positive; Obesity (BMI 30.0-34.9); Pregnant state, gestational carrier; Chronic hepatitis C during pregnancy, antepartum (HCC); Generalized anxiety disorder; History of substance abuse; and Low-lying placenta on their problem list.  ----------------------------------------------------------------------------------- Patient reports irregular contractions and braxton hicks since her last visit. Have not been severe enough for her to go to the hospital, but they are daily every few hours. She does not time how frequent they occur.   Contractions: Not present. Vag. Bleeding: None.  Movement: Present. Denies leaking of fluid.  ----------------------------------------------------------------------------------- The following portions of the patient's history were reviewed and updated as appropriate: allergies, current medications, past family history, past medical history, past social history, past surgical history and problem list. Problem list updated.   Objective  Blood pressure 120/70, weight 261 lb (118.4 kg), last menstrual period 05/05/2017. Pregravid weight 216 lb (98 kg) Total Weight Gain 45 lb (20.4 kg) Urinalysis:      Fetal Status: Fetal Heart Rate (bpm): 150 Fundal Height: 34 cm Movement: Present     General:  Alert, oriented and cooperative. Patient is in no acute distress.  Skin: Skin is warm and dry. No rash noted.     Cardiovascular: Normal heart rate noted  Respiratory: Normal respiratory effort, no problems with respiration noted  Abdomen: Soft, gravid, appropriate for gestational age. Pain/Pressure: Present     Pelvic:  Cervical exam performed Dilation: Closed Effacement (%): 0 Station: -3  Extremities: Normal range of motion.     ental Status: Normal mood and affect. Normal behavior. Normal judgment and thought content.     Assessment   27 y.o. G2P0010 at [redacted]w[redacted]d by  02/09/2018, by Last Menstrual Period presenting for routine prenatal visit  Plan   pregnancy #2 Problems (from 06/18/17 to present)    Problem Noted Resolved   Low-lying placenta 10/11/2017 by Natale Milch, MD No   Overview Signed 10/11/2017 11:48 AM by Natale Milch, MD    [ ]  recheck at [redacted] weeks      Pregnant state, gestational carrier 07/10/2017 by Grotegut, [place], MD No   Supervision of high risk pregnancy, antepartum 06/25/2017 by Farrel Conners, CNM No   Overview Addendum 12/26/2017 11:20 AM by Natale Milch, MD     Clinic [facility]renatal Labs  Dating LMP c/w 6wk1d ultrasound Blood type: O/Positive/-- (01/30 1040)   Genetic Screen Declines Antibody:Negative (01/30 1040)  Anatomic [place]  complete Rubella: <0.90 (01/30 1040)/ Varicella Immune  GTT Early:  78             Third trimester: 109 RPR: Non Reactive (01/30 1040)   Flu vaccine  06/18/2017 HBsAg: Negative (01/30 1040)   TDaP vaccine    11/28/17                                            Rhogam: N/A HIV: Non Reactive (01/30 1040)   Baby Food  breast/bottle                                             GBS: (For PCN allergy, check sensitivities)  Contraception  nexplanon Pap: LGSIL PAP with +HRHPV 12/20/2016, normal 07/21/17  Circumcision    Pediatrician    Support Person     Check LFT every trimester:  [ x] 1st  [name][X ] 2nd  [ ]  3rd         Obesity (BMI 30.0-34.9) 06/25/2017 by Farrel ConnersGutierrez, Colleen, CNM No       Gestational age appropriate obstetric  precautions including but not limited to vaginal bleeding, contractions, leaking of fluid and fetal movement were reviewed in detail with the patient.    Reassurance about Braxton Hick contractions, preterm labor warning signs reviewed.   Return in about 2 weeks (around 01/09/2018) for rob.  Adelene Idlerhristanna Lorra Freeman MD [facility], [facility] 12/26/17 5:42 PM

## 2018-01-09 ENCOUNTER — Ambulatory Visit (INDEPENDENT_AMBULATORY_CARE_PROVIDER_SITE_OTHER): Payer: Medicaid Other | Admitting: Advanced Practice Midwife

## 2018-01-09 ENCOUNTER — Encounter: Payer: Self-pay | Admitting: Advanced Practice Midwife

## 2018-01-09 VITALS — BP 110/60 | Wt 266.0 lb

## 2018-01-09 DIAGNOSIS — O99213 Obesity complicating pregnancy, third trimester: Secondary | ICD-10-CM

## 2018-01-09 DIAGNOSIS — Z3A35 35 weeks gestation of pregnancy: Secondary | ICD-10-CM

## 2018-01-09 LAB — POCT URINALYSIS DIPSTICK OB: Glucose, UA: NEGATIVE — AB

## 2018-01-09 NOTE — Progress Notes (Signed)
Routine Prenatal Care Visit  Subjective  April Keller is a 27 y.o. G2P0010 at 5684w4d being seen today for ongoing prenatal care.  She is currently monitored for the following issues for this high-risk pregnancy and has Supervision of high risk pregnancy, antepartum; Elevated liver enzymes; Positive hepatitis C antibody test; History of intravenous drug use in remission; Substance abuse in remission (HCC); Low grade squamous intraepithelial lesion (LGSIL) on cervical Pap smear; History of abnormal cervical Pap smear; MRSA (methicillin resistant staph aureus) culture positive; Obesity (BMI 30.0-34.9); Pregnant state, gestational carrier; Chronic hepatitis C during pregnancy, antepartum (HCC); Generalized anxiety disorder; and History of substance abuse on their problem list.  ----------------------------------------------------------------------------------- Patient reports no complaints.   Contractions: Not present. Vag. Bleeding: None.  Movement: Present. Denies leaking of fluid.  ----------------------------------------------------------------------------------- The following portions of the patient's history were reviewed and updated as appropriate: allergies, current medications, past family history, past medical history, past social history, past surgical history and problem list. Problem list updated.   Objective  Blood pressure 110/60, weight 266 lb (120.7 kg), last menstrual period 05/05/2017. Pregravid weight 216 lb (98 kg) Total Weight Gain 50 lb (22.7 kg) Urinalysis: Protein trace, Glucose negative  Fetal Status: Fetal Heart Rate (bpm): 140 Fundal Height: 36 cm Movement: Present     General:  Alert, oriented and cooperative. Patient is in no acute distress.  Skin: Skin is warm and dry. No rash noted.   Cardiovascular: Normal heart rate noted  Respiratory: Normal respiratory effort, no problems with respiration noted  Abdomen: Soft, gravid, appropriate for gestational age.  Pain/Pressure: Absent     Pelvic:  Cervical exam deferred        Extremities: Normal range of motion.  Edema: None  Mental Status: Normal mood and affect. Normal behavior. Normal judgment and thought content.   Assessment   27 y.o. G2P0010 at 6584w4d by  02/09/2018, by Last Menstrual Period presenting for routine prenatal visit  Plan   pregnancy #2 Problems (from 06/18/17 to present)    Problem Noted Resolved   Pregnant state, gestational carrier 07/10/2017 by Grotegut, [place], MD No   Supervision of high risk pregnancy, antepartum 06/25/2017 by Farrel ConnersGutierrez, Colleen, CNM No   Overview Addendum 12/26/2017 11:20 AM by Natale MilchSchuman, Christanna R, MD     Clinic [facility] Prenatal Labs  Dating LMP c/w 6wk1d ultrasound Blood type: O/Positive/-- (01/30 1040)   Genetic Screen Declines Antibody:Negative (01/30 1040)  Anatomic US  complete Rubella: <0.90 (01/30 1040)/ Varicella Immune  GTT Early:  78             Third trimester: 109 RPR: Non Reactive (01/30 1040)   Flu vaccine  06/18/2017 HBsAg: Negative (01/30 1040)   TDaP vaccine    11/28/17                                            Rhogam: N/A HIV: Non Reactive (01/30 1040)   Baby Food  breast/bottle                                             GBS: (For PCN allergy, check sensitivities)  Contraception  nexplanon Pap: LGSIL PAP with +HRHPV 12/20/2016, normal 07/21/17  Circumcision    Pediatrician    Support Person  Check LFT every trimester:  [ x] 1st  April Keller ] 2nd  [ ]  3rd         Obesity (BMI 30.0-34.9) 06/25/2017 by Farrel Conners, CNM No   Low-lying placenta 10/11/2017 by Natale Milch, MD 12/29/2017 by Natale Milch, MD   Overview Signed 10/11/2017 11:48 AM by Natale Milch, MD    [ ]  recheck at 30 weeks          Preterm labor symptoms and general obstetric precautions including but not limited to vaginal bleeding, contractions, leaking of fluid and fetal movement were reviewed in detail with the patient.   Return in  about 1 week (around 01/16/2018) for rob.  GBS/aptima next visit  April Keller, [facility] 01/09/2018 4:17 PM

## 2018-01-09 NOTE — Progress Notes (Signed)
ROB- no concerns 

## 2018-01-16 ENCOUNTER — Other Ambulatory Visit (HOSPITAL_COMMUNITY)
Admission: RE | Admit: 2018-01-16 | Discharge: 2018-01-16 | Disposition: A | Discharge status: Home / Self Care | Payer: Medicaid Other | Source: Ambulatory Visit | Attending: Obstetrics and Gynecology | Admitting: Obstetrics and Gynecology

## 2018-01-16 ENCOUNTER — Ambulatory Visit (INDEPENDENT_AMBULATORY_CARE_PROVIDER_SITE_OTHER): Payer: Medicaid Other | Admitting: Obstetrics and Gynecology

## 2018-01-16 ENCOUNTER — Encounter: Payer: Self-pay | Admitting: Obstetrics and Gynecology

## 2018-01-16 VITALS — BP 102/64 | Wt 266.0 lb

## 2018-01-16 DIAGNOSIS — Z23 Encounter for immunization: Secondary | ICD-10-CM | POA: Diagnosis not present

## 2018-01-16 DIAGNOSIS — Z3A36 36 weeks gestation of pregnancy: Secondary | ICD-10-CM | POA: Diagnosis not present

## 2018-01-16 DIAGNOSIS — O0993 Supervision of high risk pregnancy, unspecified, third trimester: Secondary | ICD-10-CM | POA: Insufficient documentation

## 2018-01-16 DIAGNOSIS — O26843 Uterine size-date discrepancy, third trimester: Secondary | ICD-10-CM

## 2018-01-16 DIAGNOSIS — O0990 Supervision of high risk pregnancy, unspecified, unspecified trimester: Secondary | ICD-10-CM

## 2018-01-16 NOTE — Progress Notes (Signed)
ROB GPS/Aptima/flu shot today  Desires cervical check C/o some pelvic pain/swelling

## 2018-01-16 NOTE — Progress Notes (Signed)
Routine Prenatal Care Visit  Subjective  April Keller is a 27 y.o. G2P0010 at [redacted]w[redacted]d being seen today for ongoing prenatal care.  She is currently monitored for the following issues for this high-risk pregnancy and has Supervision of high risk pregnancy, antepartum; Elevated liver enzymes; Positive hepatitis C antibody test; History of intravenous drug use in remission; Substance abuse in remission (HCC); Low grade squamous intraepithelial lesion (LGSIL) on cervical Pap smear; History of abnormal cervical Pap smear; MRSA (methicillin resistant staph aureus) culture positive; Obesity (BMI 30.0-34.9); Pregnant state, gestational carrier; Chronic hepatitis C during pregnancy, antepartum (HCC); Generalized anxiety disorder; and History of substance abuse on their problem list.  ----------------------------------------------------------------------------------- Patient reports no complaints.   Contractions: Not present. Vag. Bleeding: None.  Movement: Present. Denies leaking of fluid.  ----------------------------------------------------------------------------------- The following portions of the patient's history were reviewed and updated as appropriate: allergies, current medications, past family history, past medical history, past social history, past surgical history and problem list. Problem list updated.   Objective  Blood pressure 102/64, weight 266 lb (120.7 kg), last menstrual period 05/05/2017. Pregravid weight 216 lb (98 kg) Total Weight Gain 50 lb (22.7 kg) Urinalysis:      Fetal Status: Fetal Heart Rate (bpm): 138 Fundal Height: 38 cm Movement: Present     General:  Alert, oriented and cooperative. Patient is in no acute distress.  Skin: Skin is warm and dry. No rash noted.   Cardiovascular: Normal heart rate noted  Respiratory: Normal respiratory effort, no problems with respiration noted  Abdomen: Soft, gravid, appropriate for gestational age. Pain/Pressure: Present     Pelvic:   Cervical exam deferred Dilation: Closed Effacement (%): 0 Station: -3  Extremities: Normal range of motion.     ental Status: Normal mood and affect. Normal behavior. Normal judgment and thought content.     Assessment   27 y.o. G2P0010 at [redacted]w[redacted]d by  02/09/2018, by Last Menstrual Period presenting for routine prenatal visit  Plan   pregnancy #2 Problems (from 06/18/17 to present)    Problem Noted Resolved   Pregnant state, gestational carrier 07/10/2017 by Grotegut, [place], MD No   Supervision of high risk pregnancy, antepartum 06/25/2017 by Farrel Conners, CNM No   Overview Addendum 01/16/2018  4:11 PM by Natale Milch, MD     Clinic [facility] Prenatal Labs  Dating LMP c/w 6wk1d ultrasound Blood type: O/Positive/-- (01/30 1040)   Genetic Screen Declines Antibody:Negative (01/30 1040)  Anatomic [place]  complete Rubella: <0.90 (01/30 1040)/ Varicella Immune  GTT Early:  78             Third trimester: 109 RPR: Non Reactive (01/30 1040)   Flu vaccine  06/18/2017 & 01/16/18 HBsAg: Negative (01/30 1040)   TDaP vaccine    11/28/17                                            Rhogam: N/A HIV: Non Reactive (01/30 1040)   Baby Food  breast/bottle                                             GBS: (For PCN allergy, check sensitivities)  Contraception  nexplanon Pap: LGSIL PAP with +HRHPV 12/20/2016, normal 07/21/17  Circumcision    Pediatrician  Support Person     Check LFT every trimester:  [ x] 1st  [name][X ] 2nd  [ ]  3rd         Obesity (BMI 30.0-34.9) 06/25/2017 by Farrel ConnersGutierrez, Colleen, CNM No   Low-lying placenta 10/11/2017 by Natale MilchSchuman, Marticia Reifschneider R, MD 12/29/2017 by Natale MilchSchuman, Huzaifa Viney R, MD   Overview Signed 10/11/2017 11:48 AM by Natale MilchSchuman, Quanta Robertshaw R, MD    [ ]  recheck at [redacted] weeks          Gestational age appropriate obstetric precautions including but not limited to vaginal bleeding, contractions, leaking of fluid and fetal movement were reviewed in detail with the patient.    Discussed  supportive care for swelling of feet.  LFTs last checked at 28 weeks, can repeat at next visit.    Return in about 1 week (around 01/23/2018) for rob and US.  Adelene Idlerhristanna Estelita Iten MD [facility], [facility] 01/16/18 9:27 PM

## 2018-01-20 LAB — CULTURE, BETA STREP (GROUP B ONLY): Strep Gp B Culture: NEGATIVE

## 2018-01-21 LAB — CERVICOVAGINAL ANCILLARY ONLY
Chlamydia: NEGATIVE
Neisseria Gonorrhea: NEGATIVE

## 2018-01-26 ENCOUNTER — Ambulatory Visit (INDEPENDENT_AMBULATORY_CARE_PROVIDER_SITE_OTHER): Payer: Medicaid Other

## 2018-01-26 ENCOUNTER — Observation Stay
Admission: EM | Admit: 2018-01-26 | Discharge: 2018-01-26 | Disposition: A | Discharge status: Home / Self Care | Payer: Medicaid Other | Attending: Obstetrics and Gynecology | Admitting: Obstetrics and Gynecology

## 2018-01-26 ENCOUNTER — Encounter: Payer: Self-pay | Admitting: Obstetrics and Gynecology

## 2018-01-26 ENCOUNTER — Other Ambulatory Visit: Payer: Self-pay

## 2018-01-26 ENCOUNTER — Ambulatory Visit (INDEPENDENT_AMBULATORY_CARE_PROVIDER_SITE_OTHER): Payer: Medicaid Other | Admitting: Obstetrics and Gynecology

## 2018-01-26 VITALS — BP 138/88 | Wt 272.0 lb

## 2018-01-26 DIAGNOSIS — E669 Obesity, unspecified: Secondary | ICD-10-CM

## 2018-01-26 DIAGNOSIS — R51 Headache: Secondary | ICD-10-CM | POA: Insufficient documentation

## 2018-01-26 DIAGNOSIS — O133 Gestational [pregnancy-induced] hypertension without significant proteinuria, third trimester: Principal | ICD-10-CM | POA: Insufficient documentation

## 2018-01-26 DIAGNOSIS — O26843 Uterine size-date discrepancy, third trimester: Secondary | ICD-10-CM

## 2018-01-26 DIAGNOSIS — F1911 Other psychoactive substance abuse, in remission: Secondary | ICD-10-CM

## 2018-01-26 DIAGNOSIS — Z3A38 38 weeks gestation of pregnancy: Secondary | ICD-10-CM | POA: Insufficient documentation

## 2018-01-26 DIAGNOSIS — O98413 Viral hepatitis complicating pregnancy, third trimester: Secondary | ICD-10-CM

## 2018-01-26 DIAGNOSIS — O0990 Supervision of high risk pregnancy, unspecified, unspecified trimester: Secondary | ICD-10-CM

## 2018-01-26 DIAGNOSIS — O98419 Viral hepatitis complicating pregnancy, unspecified trimester: Secondary | ICD-10-CM

## 2018-01-26 DIAGNOSIS — B182 Chronic viral hepatitis C: Secondary | ICD-10-CM

## 2018-01-26 DIAGNOSIS — O99323 Drug use complicating pregnancy, third trimester: Secondary | ICD-10-CM

## 2018-01-26 DIAGNOSIS — Z333 Pregnant state, gestational carrier: Secondary | ICD-10-CM

## 2018-01-26 LAB — CBC
HCT: 36.7 % (ref 35.0–47.0)
Hemoglobin: 12.3 g/dL (ref 12.0–16.0)
MCH: 28.7 pg (ref 26.0–34.0)
MCHC: 33.4 g/dL (ref 32.0–36.0)
MCV: 86.1 fL (ref 80.0–100.0)
PLATELETS: 208 10*3/uL (ref 150–440)
RBC: 4.26 MIL/uL (ref 3.80–5.20)
RDW: 15.9 % — AB (ref 11.5–14.5)
WBC: 13.8 10*3/uL — ABNORMAL HIGH (ref 3.6–11.0)

## 2018-01-26 LAB — COMPREHENSIVE METABOLIC PANEL
ALT: 25 U/L (ref 0–44)
ANION GAP: 10 (ref 5–15)
AST: 24 U/L (ref 15–41)
Albumin: 2.6 g/dL — ABNORMAL LOW (ref 3.5–5.0)
Alkaline Phosphatase: 174 U/L — ABNORMAL HIGH (ref 38–126)
BUN: 9 mg/dL (ref 6–20)
CHLORIDE: 108 mmol/L (ref 98–111)
CO2: 18 mmol/L — AB (ref 22–32)
Calcium: 8.9 mg/dL (ref 8.9–10.3)
Creatinine, Ser: 0.71 mg/dL (ref 0.44–1.00)
Glucose, Bld: 82 mg/dL (ref 70–99)
POTASSIUM: 3.8 mmol/L (ref 3.5–5.1)
SODIUM: 136 mmol/L (ref 135–145)
Total Bilirubin: 0.5 mg/dL (ref 0.3–1.2)
Total Protein: 6.4 g/dL — ABNORMAL LOW (ref 6.5–8.1)

## 2018-01-26 LAB — PROTEIN / CREATININE RATIO, URINE
CREATININE, URINE: 120 mg/dL
PROTEIN CREATININE RATIO: 0.17 mg/mg{creat} — AB (ref 0.00–0.15)
TOTAL PROTEIN, URINE: 20 mg/dL

## 2018-01-26 MED ORDER — BUTALBITAL-APAP-CAFFEINE 50-325-40 MG PO TABS: 1.0000 | ORAL_TABLET | ORAL | 0 refills | Status: DC | PRN

## 2018-01-26 MED ORDER — BUTALBITAL-APAP-CAFFEINE 50-325-40 MG PO TABS
1.0000 | ORAL_TABLET | ORAL | Status: DC | PRN
  Administered 2018-01-26: 1 via ORAL
  Filled 2018-01-26: qty 1

## 2018-01-26 NOTE — Progress Notes (Signed)
U/s today. No vb. No lof. Nausea, headaches.

## 2018-01-26 NOTE — Progress Notes (Signed)
Routine Prenatal Care Visit  Subjective  April Keller is a 27 y.o. G2P0010 at [redacted]w[redacted]d being seen today for ongoing prenatal care.  She is currently monitored for the following issues for this high-risk pregnancy and has Supervision of high risk pregnancy, antepartum; Elevated liver enzymes; Positive hepatitis C antibody test; History of intravenous drug use in remission; Substance abuse in remission (HCC); Low grade squamous intraepithelial lesion (LGSIL) on cervical Pap smear; History of abnormal cervical Pap smear; MRSA (methicillin resistant staph aureus) culture positive; Obesity (BMI 30.0-34.9); Pregnant state, gestational carrier; Chronic hepatitis C during pregnancy, antepartum (HCC); Generalized anxiety disorder; and History of substance abuse on their problem list.  ----------------------------------------------------------------------------------- Patient reports headache.   Contractions: Not present. Vag. Bleeding: None.  Movement: Present. Denies leaking of fluid.  ----------------------------------------------------------------------------------- The following portions of the patient's history were reviewed and updated as appropriate: allergies, current medications, past family history, past medical history, past social history, past surgical history and problem list. Problem list updated.   Objective  Blood pressure 138/88, weight 272 lb (123.4 kg), last menstrual period 05/05/2017. Pregravid weight 216 lb (98 kg) Total Weight Gain 56 lb (25.4 kg) Urinalysis:      Fetal Status: Fetal Heart Rate (bpm): 124 Fundal Height: 41 cm Movement: Present     General:  Alert, oriented and cooperative. Patient is in no acute distress.  Skin: Skin is warm and dry. No rash noted.   Cardiovascular: Normal heart rate noted  Respiratory: Normal respiratory effort, no problems with respiration noted  Abdomen: Soft, gravid, appropriate for gestational age. Pain/Pressure: Present     Pelvic:   Cervical exam deferred        Extremities: Normal range of motion.     ental Status: Normal mood and affect. Normal behavior. Normal judgment and thought content.     Assessment   27 y.o. G2P0010 at [redacted]w[redacted]d by  02/09/2018, by Last Menstrual Period presenting for routine prenatal visit  Plan   pregnancy #2 Problems (from 06/18/17 to present)    Problem Noted Resolved   Pregnant state, gestational carrier 07/10/2017 by Grotegut, [place], MD No   Supervision of high risk pregnancy, antepartum 06/25/2017 by Farrel Conners, CNM No   Overview Addendum 01/16/2018  4:11 PM by Natale Milch, MD     Clinic [facility] Prenatal Labs  Dating LMP c/w 6wk1d ultrasound Blood type: O/Positive/-- (01/30 1040)   Genetic Screen Declines Antibody:Negative (01/30 1040)  Anatomic [place]  complete Rubella: <0.90 (01/30 1040)/ Varicella Immune  GTT Early:  78             Third trimester: 109 RPR: Non Reactive (01/30 1040)   Flu vaccine  06/18/2017 & 01/16/18 HBsAg: Negative (01/30 1040)   TDaP vaccine    11/28/17                                            Rhogam: N/A HIV: Non Reactive (01/30 1040)   Baby Food  breast/bottle                                             GBS: (For PCN allergy, check sensitivities)  Contraception  nexplanon Pap: LGSIL PAP with +HRHPV 12/20/2016, normal 07/21/17  Circumcision    Pediatrician  Support Person     Check LFT every trimester:  [ x] 1st  25 ] 2nd  [ ]  3rd         Obesity (BMI 30.0-34.9) 06/25/2017 by Farrel Conners, CNM No   Low-lying placenta 10/11/2017 by Natale Milch, MD 12/29/2017 by Natale Milch, MD   Overview Signed 10/11/2017 11:48 AM by Natale Milch, MD    [ ]  recheck at [redacted] weeks          Gestational age appropriate obstetric precautions including but not limited to vaginal bleeding, contractions, leaking of fluid and fetal movement were reviewed in detail with the patient.    Sent to the hospital for rule out PIH / gestational  hypertension evaluation. Headache for 2 days. Swelling of lower extremities and hands. Growth [place] today WNL. AFI normal.  Return in about 1 week (around 02/02/2018) for ROB.  Adelene Idler MD [facility], [facility] 01/26/18 3:46 PM

## 2018-01-26 NOTE — Discharge Summary (Signed)
Physician Discharge Summary   Patient ID: April Keller [id number] 27 y.o. Oct 16, 1990  Admit date: 01/26/2018  Discharge date and time: No discharge date for patient encounter.   Admitting Physician: Natale Milch, MD   Discharge Physician: Adelene Idler M  Admission Diagnoses: Evaluation for Gestational Hypertension and Preeclampsia Discharge Diagnoses: [redacted] week gestation.   Admission Condition: good  Discharged Condition: good  Indication for Admission: Admitted to evaluated elevated blood pressures for preeclampsia.  Hospital Course: Patient had normal blood pressures and work up was negative for preeclampsia.  Consults: None  Significant Diagnostic Studies: labs: See Epic records  Treatments: fiorcet for headache  Discharge Exam: BP 128/86   Pulse 92   Temp 98.4 F (36.9 C) (Oral)   Resp 16   Ht 5' 8.5" (1.74 m)   Wt 123.4 kg   LMP 05/05/2017 (Exact Date)   BMI 40.76 kg/m   General Appearance:    Alert, cooperative, no distress, appears stated age  Head:    Normocephalic, without obvious abnormality, atraumatic  Eyes:    PERRL, conjunctiva/corneas clear, EOM's intact, fundi    benign, both eyes  Ears:    Normal TM's and external ear canals, both ears  Nose:   Nares normal, septum midline, mucosa normal, no drainage    or sinus tenderness  Throat:   Lips, mucosa, and tongue normal; teeth and gums normal  Neck:   Supple, symmetrical, trachea midline, no adenopathy;    thyroid:  no enlargement/tenderness/nodules; no carotid   bruit or JVD  Back:     Symmetric, no curvature, ROM normal, no CVA tenderness  Lungs:     Clear to auscultation bilaterally, respirations unlabored  Chest Wall:    No tenderness or deformity   Heart:    Regular rate and rhythm, S1 and S2 normal, no murmur, rub   or gallop  Breast Exam:    No tenderness, masses, or nipple abnormality  Abdomen:     Soft, non-tender, bowel sounds active all four quadrants,    no masses, no  organomegaly  Genitalia:    Normal female without lesion, discharge or tenderness  Rectal:    Normal tone, normal prostate, no masses or tenderness;   guaiac negative stool  Extremities:   Extremities normal, atraumatic, no cyanosis or edema  Pulses:   2+ and symmetric all extremities  Skin:   Skin color, texture, turgor normal, no rashes or lesions  Lymph nodes:   Cervical, supraclavicular, and axillary nodes normal  Neurologic:   CNII-XII intact, normal strength, sensation and reflexes    throughout    Disposition: Discharge disposition: 01-Home or Self Care       Patient Instructions:  Allergies as of 01/26/2018      Reactions   Latex Itching, Rash      Medication List    TAKE these medications   butalbital-acetaminophen-caffeine 50-325-40 MG tablet Commonly known as:  FIORICET, ESGIC Take 1 tablet by mouth every 4 (four) hours as needed for headache.   multivitamin-prenatal 27-0.8 MG Tabs tablet Take 1 tablet by mouth daily at 12 noon.      Activity: activity as tolerated Diet: regular diet Wound Care: none needed  Follow-up with [facility] in 3 days for blood pressure check.  Signed: Natale Milch 01/26/2018 7:02 PM

## 2018-01-29 ENCOUNTER — Telehealth: Payer: Self-pay

## 2018-01-29 NOTE — Telephone Encounter (Signed)
Please advise 

## 2018-01-29 NOTE — Telephone Encounter (Signed)
Pt calling to let CS know her Bps have been running between 119-126 throughout the day.  It was high once as she was arguing with her mother.  [phone]

## 2018-01-30 NOTE — Telephone Encounter (Signed)
Pt aware via vm to keep inducation appt and to call if she has any concerns or questions

## 2018-01-30 NOTE — Telephone Encounter (Signed)
Those are normal blood pressures. It was likely high from the argument. She should keep her appointment for induction.

## 2018-02-02 ENCOUNTER — Ambulatory Visit (INDEPENDENT_AMBULATORY_CARE_PROVIDER_SITE_OTHER): Payer: Medicaid Other | Admitting: Obstetrics and Gynecology

## 2018-02-02 ENCOUNTER — Inpatient Hospital Stay
Admission: EM | Admit: 2018-02-02 | Discharge: 2018-02-05 | DRG: 806 | Disposition: A | Discharge status: Home / Self Care | Payer: Medicaid Other | Attending: Obstetrics and Gynecology | Admitting: Obstetrics and Gynecology

## 2018-02-02 ENCOUNTER — Encounter: Payer: Self-pay | Admitting: Obstetrics and Gynecology

## 2018-02-02 VITALS — BP 136/78 | Wt 272.0 lb

## 2018-02-02 DIAGNOSIS — Z87891 Personal history of nicotine dependence: Secondary | ICD-10-CM

## 2018-02-02 DIAGNOSIS — Z3A39 39 weeks gestation of pregnancy: Secondary | ICD-10-CM

## 2018-02-02 DIAGNOSIS — O98413 Viral hepatitis complicating pregnancy, third trimester: Secondary | ICD-10-CM

## 2018-02-02 DIAGNOSIS — Z91040 Latex allergy status: Secondary | ICD-10-CM

## 2018-02-02 DIAGNOSIS — Z8614 Personal history of Methicillin resistant Staphylococcus aureus infection: Secondary | ICD-10-CM

## 2018-02-02 DIAGNOSIS — B182 Chronic viral hepatitis C: Secondary | ICD-10-CM

## 2018-02-02 DIAGNOSIS — O134 Gestational [pregnancy-induced] hypertension without significant proteinuria, complicating childbirth: Principal | ICD-10-CM | POA: Diagnosis present

## 2018-02-02 DIAGNOSIS — R768 Other specified abnormal immunological findings in serum: Secondary | ICD-10-CM | POA: Diagnosis present

## 2018-02-02 DIAGNOSIS — O0990 Supervision of high risk pregnancy, unspecified, unspecified trimester: Secondary | ICD-10-CM

## 2018-02-02 DIAGNOSIS — O99213 Obesity complicating pregnancy, third trimester: Secondary | ICD-10-CM

## 2018-02-02 DIAGNOSIS — Z3490 Encounter for supervision of normal pregnancy, unspecified, unspecified trimester: Secondary | ICD-10-CM

## 2018-02-02 DIAGNOSIS — F1991 Other psychoactive substance use, unspecified, in remission: Secondary | ICD-10-CM

## 2018-02-02 DIAGNOSIS — O9842 Viral hepatitis complicating childbirth: Secondary | ICD-10-CM | POA: Diagnosis present

## 2018-02-02 DIAGNOSIS — O98419 Viral hepatitis complicating pregnancy, unspecified trimester: Secondary | ICD-10-CM

## 2018-02-02 DIAGNOSIS — O99214 Obesity complicating childbirth: Secondary | ICD-10-CM | POA: Diagnosis present

## 2018-02-02 DIAGNOSIS — E669 Obesity, unspecified: Secondary | ICD-10-CM | POA: Diagnosis present

## 2018-02-02 DIAGNOSIS — Z87898 Personal history of other specified conditions: Secondary | ICD-10-CM

## 2018-02-02 NOTE — Progress Notes (Signed)
Routine Prenatal Care Visit  Subjective  April Keller is a 27 y.o. G2P0010 at [redacted]w[redacted]d being seen today for ongoing prenatal care.  She is currently monitored for the following issues for this high-risk pregnancy and has Supervision of high risk pregnancy, antepartum; Elevated liver enzymes; Positive hepatitis C antibody test; History of intravenous drug use in remission; Substance abuse in remission (HCC); Low grade squamous intraepithelial lesion (LGSIL) on cervical Pap smear; History of abnormal cervical Pap smear; MRSA (methicillin resistant staph aureus) culture positive; Obesity (BMI 30.0-34.9); Pregnant state, gestational carrier; Chronic hepatitis C during pregnancy, antepartum (HCC); Generalized anxiety disorder; and History of substance abuse on their problem list.  ----------------------------------------------------------------------------------- Patient reports no complaints.   Contractions: Irregular. Vag. Bleeding: None.  Movement: Present. Denies leaking of fluid.  ----------------------------------------------------------------------------------- The following portions of the patient's history were reviewed and updated as appropriate: allergies, current medications, past family history, past medical history, past social history, past surgical history and problem list. Problem list updated.   Objective  Blood pressure 136/78, weight 272 lb (123.4 kg), last menstrual period 05/05/2017. Pregravid weight 216 lb (98 kg) Total Weight Gain 56 lb (25.4 kg) Urinalysis: Urine Protein    Urine Glucose    Fetal Status: Fetal Heart Rate (bpm): 125   Movement: Present     General:  Alert, oriented and cooperative. Patient is in no acute distress.  Skin: Skin is warm and dry. No rash noted.   Cardiovascular: Normal heart rate noted  Respiratory: Normal respiratory effort, no problems with respiration noted  Abdomen: Soft, gravid, appropriate for gestational age. Pain/Pressure: Present      Pelvic:  Cervical exam deferred        Extremities: Normal range of motion.  Edema: None  Mental Status: Normal mood and affect. Normal behavior. Normal judgment and thought content.   Assessment   27 y.o. G2P0010 at [redacted]w[redacted]d by  02/09/2018, by Last Menstrual Period presenting for routine prenatal visit  Plan   pregnancy #2 Problems (from 06/18/17 to present)    Problem Noted Resolved   Pregnant state, gestational carrier 07/10/2017 by Keller, [place], MD No   Supervision of high risk pregnancy, antepartum 06/25/2017 by April Keller, CNM No   Overview Addendum 01/16/2018  4:11 PM by April Milch, MD     Clinic [facility] Prenatal Labs  Dating LMP c/w 6wk1d ultrasound Blood type: O/Positive/-- (01/30 1040)   Genetic Screen Declines Antibody:Negative (01/30 1040)  Anatomic [place]  complete Rubella: <0.90 (01/30 1040)/ Varicella Immune  GTT Early:  78             Third trimester: 109 RPR: Non Reactive (01/30 1040)   Flu vaccine  06/18/2017 & 01/16/18 HBsAg: Negative (01/30 1040)   TDaP vaccine    11/28/17                                            Rhogam: N/A HIV: Non Reactive (01/30 1040)   Baby Food  breast/bottle                                             GBS: (For PCN allergy, check sensitivities)  Contraception  nexplanon Pap: LGSIL PAP with +HRHPV 12/20/2016, normal 07/21/17  Circumcision    Pediatrician  Support Person     Check LFT every trimester:  [ x] 1st  April Keller[X ] 2nd  [ ]  3rd         Obesity (BMI 30.0-34.9) 06/25/2017 by April ConnersGutierrez, April, CNM No   Low-lying placenta 10/11/2017 by April MilchSchuman, April R, MD 12/29/2017 by April MilchSchuman, April R, MD   Overview Signed 10/11/2017 11:48 AM by April MilchSchuman, April R, MD    [ ]  recheck at 30 weeks          Term labor symptoms and general obstetric precautions including but not limited to vaginal bleeding, contractions, leaking of fluid and fetal movement were reviewed in detail with the patient. Please refer to After Visit  Summary for other counseling recommendations.   Return if symptoms worsen or fail to improve.  April MohairStephen Jackson, MD, Merlinda FrederickFACOG [facility], [facility] 02/02/2018 3:20 PM

## 2018-02-03 ENCOUNTER — Inpatient Hospital Stay: Payer: Medicaid Other | Admitting: Anesthesiology

## 2018-02-03 ENCOUNTER — Other Ambulatory Visit: Payer: Self-pay

## 2018-02-03 ENCOUNTER — Encounter: Payer: Self-pay | Admitting: *Deleted

## 2018-02-03 DIAGNOSIS — O9842 Viral hepatitis complicating childbirth: Secondary | ICD-10-CM | POA: Diagnosis present

## 2018-02-03 DIAGNOSIS — Z3490 Encounter for supervision of normal pregnancy, unspecified, unspecified trimester: Secondary | ICD-10-CM | POA: Diagnosis present

## 2018-02-03 DIAGNOSIS — O134 Gestational [pregnancy-induced] hypertension without significant proteinuria, complicating childbirth: Secondary | ICD-10-CM | POA: Diagnosis present

## 2018-02-03 DIAGNOSIS — Z8614 Personal history of Methicillin resistant Staphylococcus aureus infection: Secondary | ICD-10-CM | POA: Diagnosis not present

## 2018-02-03 DIAGNOSIS — B1920 Unspecified viral hepatitis C without hepatic coma: Secondary | ICD-10-CM

## 2018-02-03 DIAGNOSIS — Z91040 Latex allergy status: Secondary | ICD-10-CM | POA: Diagnosis not present

## 2018-02-03 DIAGNOSIS — E669 Obesity, unspecified: Secondary | ICD-10-CM | POA: Diagnosis present

## 2018-02-03 DIAGNOSIS — Z3A39 39 weeks gestation of pregnancy: Secondary | ICD-10-CM

## 2018-02-03 DIAGNOSIS — Z87891 Personal history of nicotine dependence: Secondary | ICD-10-CM | POA: Diagnosis not present

## 2018-02-03 DIAGNOSIS — O99214 Obesity complicating childbirth: Secondary | ICD-10-CM | POA: Diagnosis present

## 2018-02-03 DIAGNOSIS — B182 Chronic viral hepatitis C: Secondary | ICD-10-CM | POA: Diagnosis present

## 2018-02-03 LAB — CBC
HEMATOCRIT: 36.1 % (ref 35.0–47.0)
HEMOGLOBIN: 11.9 g/dL — AB (ref 12.0–16.0)
MCH: 28.5 pg (ref 26.0–34.0)
MCHC: 33.1 g/dL (ref 32.0–36.0)
MCV: 86.2 fL (ref 80.0–100.0)
Platelets: 215 10*3/uL (ref 150–440)
RBC: 4.19 MIL/uL (ref 3.80–5.20)
RDW: 16.6 % — AB (ref 11.5–14.5)
WBC: 15.1 10*3/uL — ABNORMAL HIGH (ref 3.6–11.0)

## 2018-02-03 LAB — TYPE AND SCREEN
ABO/RH(D): O POS
Antibody Screen: NEGATIVE

## 2018-02-03 LAB — URINE DRUG SCREEN, QUALITATIVE (ARMC ONLY)
AMPHETAMINES, UR SCREEN: NOT DETECTED
Barbiturates, Ur Screen: NOT DETECTED
Benzodiazepine, Ur Scrn: NOT DETECTED
COCAINE METABOLITE, UR ~~LOC~~: NOT DETECTED
Cannabinoid 50 Ng, Ur ~~LOC~~: NOT DETECTED
MDMA (Ecstasy)Ur Screen: NOT DETECTED
METHADONE SCREEN, URINE: NOT DETECTED
OPIATE, UR SCREEN: NOT DETECTED
PHENCYCLIDINE (PCP) UR S: NOT DETECTED
Tricyclic, Ur Screen: NOT DETECTED

## 2018-02-03 MED ORDER — OXYTOCIN 10 UNIT/ML IJ SOLN
INTRAMUSCULAR | Status: AC
  Filled 2018-02-03: qty 2

## 2018-02-03 MED ORDER — LACTATED RINGERS AMNIOINFUSION
INTRAVENOUS | Status: DC
  Administered 2018-02-03: 250 mL via INTRAUTERINE
  Filled 2018-02-03 (×4): qty 1000

## 2018-02-03 MED ORDER — TERBUTALINE SULFATE 1 MG/ML IJ SOLN: 0.2500 mg | Freq: Once | INTRAMUSCULAR | Status: DC | PRN

## 2018-02-03 MED ORDER — OXYTOCIN BOLUS FROM INFUSION
500.0000 mL | Freq: Once | INTRAVENOUS | Status: AC
  Administered 2018-02-03: 500 mL via INTRAVENOUS

## 2018-02-03 MED ORDER — MISOPROSTOL 25 MCG QUARTER TABLET
25.0000 ug | ORAL_TABLET | ORAL | Status: DC | PRN
  Administered 2018-02-03 (×3): 25 ug via VAGINAL
  Filled 2018-02-03 (×3): qty 1

## 2018-02-03 MED ORDER — IBUPROFEN 600 MG PO TABS
600.0000 mg | ORAL_TABLET | Freq: Four times a day (QID) | ORAL | Status: DC
  Administered 2018-02-03 – 2018-02-05 (×6): 600 mg via ORAL
  Filled 2018-02-03 (×6): qty 1

## 2018-02-03 MED ORDER — BUPIVACAINE HCL (PF) 0.25 % IJ SOLN
INTRAMUSCULAR | Status: DC | PRN
  Administered 2018-02-03: 5 mL via EPIDURAL
  Administered 2018-02-03: 3 mL via EPIDURAL

## 2018-02-03 MED ORDER — BENZOCAINE-MENTHOL 20-0.5 % EX AERO
1.0000 "application " | INHALATION_SPRAY | CUTANEOUS | Status: DC | PRN
  Administered 2018-02-03: 1 via TOPICAL
  Filled 2018-02-03: qty 56

## 2018-02-03 MED ORDER — FENTANYL 2.5 MCG/ML W/ROPIVACAINE 0.15% IN NS 100 ML EPIDURAL (ARMC): 12.0000 mL/h | EPIDURAL | Status: DC

## 2018-02-03 MED ORDER — PHENYLEPHRINE 40 MCG/ML (10ML) SYRINGE FOR IV PUSH (FOR BLOOD PRESSURE SUPPORT)
80.0000 ug | PREFILLED_SYRINGE | INTRAVENOUS | Status: DC | PRN
  Filled 2018-02-03: qty 5

## 2018-02-03 MED ORDER — EPHEDRINE 5 MG/ML INJ
10.0000 mg | INTRAVENOUS | Status: DC | PRN
  Filled 2018-02-03: qty 2

## 2018-02-03 MED ORDER — DIPHENHYDRAMINE HCL 50 MG/ML IJ SOLN: 12.5000 mg | INTRAMUSCULAR | Status: DC | PRN

## 2018-02-03 MED ORDER — LIDOCAINE HCL (PF) 1 % IJ SOLN
INTRAMUSCULAR | Status: AC
  Filled 2018-02-03: qty 30

## 2018-02-03 MED ORDER — OXYTOCIN 40 UNITS IN LACTATED RINGERS INFUSION - SIMPLE MED
1.0000 m[IU]/min | INTRAVENOUS | Status: DC
  Administered 2018-02-03: 1 m[IU]/min via INTRAVENOUS

## 2018-02-03 MED ORDER — LACTATED RINGERS IV SOLN
INTRAVENOUS | Status: DC
  Administered 2018-02-03 (×2): via INTRAVENOUS

## 2018-02-03 MED ORDER — MISOPROSTOL 200 MCG PO TABS
ORAL_TABLET | ORAL | Status: AC
  Filled 2018-02-03: qty 4

## 2018-02-03 MED ORDER — LIDOCAINE-EPINEPHRINE (PF) 1.5 %-1:200000 IJ SOLN
INTRAMUSCULAR | Status: DC | PRN
  Administered 2018-02-03: 4 mL via EPIDURAL

## 2018-02-03 MED ORDER — LIDOCAINE HCL (PF) 1 % IJ SOLN
INTRAMUSCULAR | Status: DC | PRN
  Administered 2018-02-03: 3 mL via SUBCUTANEOUS

## 2018-02-03 MED ORDER — ACETAMINOPHEN 325 MG PO TABS: 650.0000 mg | ORAL_TABLET | ORAL | Status: DC | PRN

## 2018-02-03 MED ORDER — OXYTOCIN 40 UNITS IN LACTATED RINGERS INFUSION - SIMPLE MED
1.0000 m[IU]/min | INTRAVENOUS | Status: DC
  Filled 2018-02-03 (×2): qty 1000

## 2018-02-03 MED ORDER — FENTANYL CITRATE (PF) 100 MCG/2ML IJ SOLN
50.0000 ug | INTRAMUSCULAR | Status: DC | PRN
  Administered 2018-02-03: 50 ug via INTRAVENOUS
  Filled 2018-02-03: qty 2

## 2018-02-03 MED ORDER — LACTATED RINGERS IV SOLN
500.0000 mL | INTRAVENOUS | Status: DC | PRN
  Administered 2018-02-03: 500 mL via INTRAVENOUS

## 2018-02-03 MED ORDER — FENTANYL 2.5 MCG/ML W/ROPIVACAINE 0.15% IN NS 100 ML EPIDURAL (ARMC)
EPIDURAL | Status: DC | PRN
  Administered 2018-02-03: 12 mL/h via EPIDURAL

## 2018-02-03 MED ORDER — ONDANSETRON HCL 4 MG/2ML IJ SOLN: 4.0000 mg | Freq: Four times a day (QID) | INTRAMUSCULAR | Status: DC | PRN

## 2018-02-03 MED ORDER — AMMONIA AROMATIC IN INHA
RESPIRATORY_TRACT | Status: AC
  Filled 2018-02-03: qty 10

## 2018-02-03 MED ORDER — FENTANYL 2.5 MCG/ML W/ROPIVACAINE 0.15% IN NS 100 ML EPIDURAL (ARMC)
EPIDURAL | Status: AC
  Filled 2018-02-03: qty 100

## 2018-02-03 MED ORDER — LACTATED RINGERS IV SOLN: 500.0000 mL | Freq: Once | INTRAVENOUS | Status: DC

## 2018-02-03 MED ORDER — OXYTOCIN 40 UNITS IN LACTATED RINGERS INFUSION - SIMPLE MED
2.5000 [IU]/h | INTRAVENOUS | Status: DC
  Administered 2018-02-03: 2.5 [IU]/h via INTRAVENOUS

## 2018-02-03 NOTE — Anesthesia Procedure Notes (Signed)
Epidural Patient location during procedure: OB  Staffing Performed: anesthesiologist   Preanesthetic Checklist Completed: patient identified, site marked, surgical consent, pre-op evaluation, timeout performed, IV checked, risks and benefits discussed and monitors and equipment checked  Epidural Patient position: sitting Prep: Betadine Patient monitoring: heart rate, continuous pulse ox and blood pressure Approach: midline Location: L3-L4 Injection technique: LOR saline  Needle:  Needle type: Tuohy  Needle gauge: 17 G Needle length: 9 cm and 9 Needle insertion depth: 9 cm Catheter type: closed end flexible Catheter size: 19 Gauge Test dose: negative and 1.5% lidocaine with Epi 1:200 K  Assessment Sensory level: T10 Events: blood not aspirated, injection not painful, no injection resistance, negative IV test and no paresthesia  Additional Notes   Patient tolerated the insertion well without complications.-SATD -IVTD. No paresthesia. [facility] to [facility] nursing for VS and dosingReason for block:procedure for pain

## 2018-02-03 NOTE — Anesthesia Preprocedure Evaluation (Signed)
Anesthesia Evaluation  Patient identified by MRN, date of birth, ID band Patient awake    Reviewed: Allergy & Precautions, H&P , NPO status , Patient's Chart, lab work & pertinent test results, reviewed documented beta blocker date and time   Airway Mallampati: III  TM Distance: >3 FB Neck ROM: full    Dental no notable dental hx. (+) Teeth Intact   Pulmonary neg pulmonary ROS, Current Smoker, former smoker,    Pulmonary exam normal breath sounds clear to auscultation       Cardiovascular Exercise Tolerance: Good negative cardio ROS   Rhythm:regular Rate:Normal     Neuro/Psych  Headaches, PSYCHIATRIC DISORDERS Anxiety negative neurological ROS  negative psych ROS   GI/Hepatic negative GI ROS, Neg liver ROS, (+) Hepatitis -  Endo/Other  negative endocrine ROSdiabetes  Renal/GU      Musculoskeletal   Abdominal   Peds  Hematology negative hematology ROS (+)   Anesthesia Other Findings   Reproductive/Obstetrics (+) Pregnancy                             Anesthesia Physical Anesthesia Plan  ASA: III  Anesthesia Plan: Epidural   Post-op Pain Management:    Induction:   PONV Risk Score and Plan:   Airway Management Planned:   Additional Equipment:   Intra-op Plan:   Post-operative Plan:   Informed Consent: I have reviewed the patients History and Physical, chart, labs and discussed the procedure including the risks, benefits and alternatives for the proposed anesthesia with the patient or authorized representative who has indicated his/her understanding and acceptance.     Plan Discussed with:   Anesthesia Plan Comments:         Anesthesia Quick Evaluation

## 2018-02-03 NOTE — Discharge Summary (Signed)
OB Discharge Summary     Patient Name: April GoodnessJessica Eatherly DOB: Feb 09, 1991 MRN: [id number]  Date of admission: 02/02/2018 Delivering MD: Thomasene MohairStephen Jackson, MD  Date of Delivery: 02/03/2018  Date of discharge: 02/05/2018  Admitting diagnosis: 39 wks induction Intrauterine pregnancy: 7210w1d     Secondary diagnosis: Hepatitis C     Discharge diagnosis: Term Pregnancy Delivered                                                                                                Post partum procedures: None  Augmentation: Pitocin, Cytotec and Foley Balloon  Complications: None  Hospital course:  Induction of Labor With Vaginal Delivery   27 y.o. yo G2P0010 at 4010w1d was admitted to the hospital 02/02/2018 for induction of labor.  Indication for induction: elective with some concern for gestational hypertension.  Patient had an uncomplicated labor course as follows: Induction of labor with misoprostol and foley balloon with pitocin for labor augmentation.  Spontaneous membrane Rupture Time/Date: 5:08 PM ,02/03/2018   Intrapartum Procedures: Episiotomy: None [1]                                         Lacerations:  None [1]  Patient had delivery of a Viable infant.  Information for the patient's newborn:  Lamarr LulasHill, Girl Taelor [[id number]][[id number]]  Delivery Method: Vag-Spont   02/03/2018  Details of delivery can be found in separate delivery note.  Patient had a routine postpartum course. Patient is discharged home 02/05/18.  Physical exam  Vitals:   02/04/18 1107 02/04/18 1618 02/04/18 2330 02/05/18 0800  BP: 118/71 123/69 128/66 127/72  Pulse: 82 79 75 72  Resp: 20 18 20 20   Temp: 98.5 F (36.9 C) 98.5 F (36.9 C) 98.1 F (36.7 C) 98.2 F (36.8 C)  TempSrc: Oral Oral Oral Oral  SpO2:  98% 100% 100%  Weight:      Height:       General: alert, cooperative and no distress Lochia: appropriate Uterine Fundus: firm Incision: N/A DVT Evaluation: No evidence of DVT seen on physical exam.  Labs: Lab Results   Component Value Date   WBC 15.9 (H) 02/04/2018   HGB 11.0 (L) 02/04/2018   HCT 32.6 (L) 02/04/2018   MCV 86.8 02/04/2018   PLT 208 02/04/2018    Discharge instruction: per After Visit Summary.  Medications:  Allergies as of 02/05/2018      Reactions   Latex Itching, Rash      Medication List    STOP taking these medications   butalbital-acetaminophen-caffeine 50-325-40 MG tablet Commonly known as:  FIORICET, ESGIC     TAKE these medications   multivitamin-prenatal 27-0.8 MG Tabs tablet Take 1 tablet by mouth daily at 12 noon.       Diet: routine diet  Activity: Advance as tolerated. Pelvic rest for 6 weeks.   Outpatient follow up: Follow-up Information    Conard NovakJackson, Stephen D, MD. Schedule an appointment as soon as possible for a visit in 6 week(s).  Specialty:  Obstetrics and Gynecology Why:  6 week postpartum Contact information: [address] [place] [address] [phone]             Postpartum contraception: Nexplanon Rhogam Given postpartum: no Rubella vaccine given postpartum: yes Varicella vaccine given postpartum: no TDaP given antepartum or postpartum: 11/28/17  Newborn Data: Live born female  Birth Weight:   APGAR: 8, 9  Newborn Delivery   Birth date/time:  02/03/2018 20:45:00 Delivery type:  Vaginal, Spontaneous      Baby Feeding: Formula  Disposition: home with mother  SIGNED: Marcelyn Bruins, CNM 02/05/2018  9:01 AM

## 2018-02-03 NOTE — Progress Notes (Signed)
Labor Check  Subj:  Complaints: none, mild cramping with misoprostol   Obj:  BP (!) 122/53   Pulse 80   Temp 98.3 F (36.8 C) (Oral)   Resp 14   Ht 5' 8.5" (1.74 m)   Wt 122.5 kg   LMP 05/05/2017 (Exact Date)   BMI 40.46 kg/m     Cervix: Dilation: 1 / Effacement (%): 50 / Station: -2   Foley catheter balloon placed with 30 mL sterile saline without difficulty.  Misoprostol 25 mcg placed without difficulty Baseline FHR: 125 beats/min   Variability: moderate   Accelerations: present   Decelerations: absent Contractions: present frequency: irritability Overall assessment: cat 1  A/P: 27 y.o. G2P0010 female at 3056w1d with IOL.  1.  Labor: continue cervical ripening with foley balloon and misoprostol  2.  FWB: reassuring, Overall assessment: category 1  3.  GBS negative 01/16/18  4.  Pain: prn 5.  Recheck: 4 hours or PRN   Thomasene MohairStephen Tilak Oakley, MD, Merlinda FrederickFACOG [facility], [facility] 02/03/2018 11:04 AM

## 2018-02-03 NOTE — Progress Notes (Signed)
Spoke to Lyondell ChemicalSarah Wall about need for contact precautions due to hx of positive MRSA culture. States there is no need for precautions.

## 2018-02-03 NOTE — H&P (Signed)
Obstetrics Admission History & Physical   Planned induction of labor   HPI:  27 y.o. G2P0010 @ 106w1d (EDD 02/09/2018, by Last Menstrual Period). Admitted on 02/02/2018:   Patient Active Problem List   Diagnosis Date Noted  . Encounter for planned induction of labor 02/03/2018  . Chronic hepatitis C during pregnancy, antepartum (HCC) 08/28/2017  . Pregnant state, gestational carrier 07/10/2017  . Supervision of high risk pregnancy, antepartum 06/25/2017  . Elevated liver enzymes 06/25/2017  . Positive hepatitis C antibody test 06/25/2017  . History of intravenous drug use in remission 06/25/2017  . Obesity (BMI 30.0-34.9) 06/25/2017  . Substance abuse in remission (HCC)   . History of abnormal cervical Pap smear   . Low grade squamous intraepithelial lesion (LGSIL) on cervical Pap smear 12/30/2016  . MRSA (methicillin resistant staph aureus) culture positive 01/19/2016  . Generalized anxiety disorder 12/04/2015  . History of substance abuse 12/04/2015     Presents for planned induction of labor at term due to gestational hypertension.  [facility]: at [facility]. Pregnancy complicated by Hepatitis C carrier, gestational hypertension.  ROS: A review of systems was performed and negative, except as stated in the above HPI. PMHx:  Past Medical History:  Diagnosis Date  . Hepatitis C   . History of abnormal cervical Pap smear    since 2016  . History of abnormal cervical Pap smear    since 12/28/2014  . HPV (human papilloma virus) infection   . Low grade squamous intraepithelial lesion (LGSIL) on cervical Pap smear 12/30/2016   with positive HRHPV  . Migraine   . MRSA (methicillin resistant staph aureus) culture positive 01/2016   facial cellulitis  . 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 COLONOSCOPY  2015/2016?   abdominal pain  . COLPOSCOPY  2017   B9 biopsies   . TONSILLECTOMY     Medications:  Medications Prior to Admission  Medication Sig Dispense Refill Last Dose  . butalbital-acetaminophen-caffeine (FIORICET, ESGIC) 50-325-40 MG tablet Take 1 tablet by mouth every 4 (four) hours as needed for headache. (Patient not taking: Reported on 02/03/2018) 14 tablet 0 Not Taking at Unknown time  . Prenatal Vit-Fe Fumarate-FA (MULTIVITAMIN-PRENATAL) 27-0.8 MG TABS tablet Take 1 tablet by mouth daily at 12 noon.   Past Week at Unknown time   Allergies: is allergic to latex. OBHx:  OB History  Gravida Para Term Preterm AB Living  2       1 0  SAB TAB Ectopic Multiple Live Births  1            # Outcome Date GA Lbr Len/2nd Weight Sex Delivery Anes PTL Lv  2 Current           1 SAB 03/12/17 [redacted]w[redacted]d          FHx: Negative/unremarkable except as detailed in HPI. No family history of birth defects. Soc Hx: Former smoker, Alcohol: none and Recreational drug use: former.  Objective:   Vitals:   02/03/18 0012  BP: 132/80  Pulse: (!) 104   Constitutional: Well nourished, well developed female in no acute distress.  HEENT: normal Skin: Warm and dry.  Cardiovascular: Tachycardia, regular rhythm Extremity: 1+ bilateral lower extremity edema Respiratory: Clear to auscultation bilaterally. Normal respiratory effort Abdomen: non-tender Neuro: Cranial nerves grossly intact Psych: Alert and Oriented x3. No memory deficits. Normal mood and affect.  MS: normal gait, normal bilateral lower extremity ROM/strength/stability.  Cervix: 0.5/30/-3 (RN exam)  EFM: FHR: 135 bpm, variability: moderate,  accelerations: Present,  decelerations: Absent Toco: Uterine irritability   Perinatal info:  Blood type: O positive Rubella - Not immune Varicella - Immune TDaP - Given during third trimester of this pregnancy RPR NR / HIV Neg/ HBsAg Neg   Assessment & Plan:   27 y.o. G2P0010 @ 8073w1d, Admitted on 02/02/2018 for planned induction of labor.    Admit for  labor, Observe for cervical change, Fetal Wellbeing Reassuring, Epidural when ready, AROM when Appropriate and GBS status negative, treat as needed  Marcelyn BruinsJacelyn Tisha Cline, CNM [facility], [facility] 02/03/2018

## 2018-02-03 NOTE — Progress Notes (Signed)
Anesthesia notified for epidural placement @ 1917. Anesthesia @ bedside @ 1936.  Catheter placed @ 1947 Test dose @ 1948 Heart rate 84 @ 1948 Bolus #1 @ 1952 Bolus #2 @ 1955 Drip started @ 1957

## 2018-02-03 NOTE — Progress Notes (Signed)
Labor Check  Subj:  Complaints: painful contractions   Obj:  BP 137/66   Pulse 62   Temp 98.5 F (36.9 C) (Oral)   Resp 14   Ht 5' 8.5" (1.74 m)   Wt 122.5 kg   LMP 05/05/2017 (Exact Date)   BMI 40.46 kg/m  Dose (milli-units/min) Oxytocin: 7 milli-units/min  Cervix: Dilation: 4 / Effacement (%): 80 / Station: -1   IUPC Placed without difficulty Baseline FHR: 120 beats/min   Variability: moderate   Accelerations: present   Decelerations: present, variable decelerations to 90, short duration with quick return to baseline and moderate variability Contractions: present frequency: not tracing well Overall assessment: cat 1 overall, category 2 at times with variable decelerations  A/P: 27 y.o. G2P0010 female at [redacted]w[redacted]d with IOL.  1.  Labor: continue pitocin per protocol  2.  FWB: reassuring, Overall assessment: category 1 (episodes of category 2)  3.  GBS neg  4.  Pain: prn, may have epidural when ready 5.  Recheck: prn   Thomasene MohairStephen Dickie Cloe, MD, Merlinda FrederickFACOG [facility], [facility] 02/03/2018 6:37 PM

## 2018-02-04 LAB — CBC
HCT: 32.6 % — ABNORMAL LOW (ref 35.0–47.0)
Hemoglobin: 11 g/dL — ABNORMAL LOW (ref 12.0–16.0)
MCH: 29.4 pg (ref 26.0–34.0)
MCHC: 33.8 g/dL (ref 32.0–36.0)
MCV: 86.8 fL (ref 80.0–100.0)
PLATELETS: 208 10*3/uL (ref 150–440)
RBC: 3.76 MIL/uL — ABNORMAL LOW (ref 3.80–5.20)
RDW: 16.5 % — ABNORMAL HIGH (ref 11.5–14.5)
WBC: 15.9 10*3/uL — AB (ref 3.6–11.0)

## 2018-02-04 LAB — RPR: RPR Ser Ql: NONREACTIVE

## 2018-02-04 MED ORDER — PRENATAL MULTIVITAMIN CH: 1.0000 | ORAL_TABLET | Freq: Every day | ORAL | Status: DC

## 2018-02-04 MED ORDER — SENNOSIDES-DOCUSATE SODIUM 8.6-50 MG PO TABS
2.0000 | ORAL_TABLET | ORAL | Status: DC
  Administered 2018-02-04: 2 via ORAL
  Filled 2018-02-04: qty 2

## 2018-02-04 MED ORDER — SIMETHICONE 80 MG PO CHEW: 80.0000 mg | CHEWABLE_TABLET | ORAL | Status: DC | PRN

## 2018-02-04 MED ORDER — WITCH HAZEL-GLYCERIN EX PADS: 1.0000 "application " | MEDICATED_PAD | CUTANEOUS | Status: DC | PRN

## 2018-02-04 MED ORDER — MEASLES, MUMPS & RUBELLA VAC ~~LOC~~ INJ
0.5000 mL | INJECTION | SUBCUTANEOUS | Status: AC | PRN
  Administered 2018-02-05: 0.5 mL via SUBCUTANEOUS
  Filled 2018-02-04: qty 0.5

## 2018-02-04 MED ORDER — FERROUS SULFATE 325 (65 FE) MG PO TABS
325.0000 mg | ORAL_TABLET | Freq: Two times a day (BID) | ORAL | Status: DC
  Administered 2018-02-04 – 2018-02-05 (×2): 325 mg via ORAL
  Filled 2018-02-04 (×2): qty 1

## 2018-02-04 MED ORDER — DIPHENHYDRAMINE HCL 25 MG PO CAPS: 25.0000 mg | ORAL_CAPSULE | Freq: Four times a day (QID) | ORAL | Status: DC | PRN

## 2018-02-04 MED ORDER — COCONUT OIL OIL: 1.0000 "application " | TOPICAL_OIL | Status: DC | PRN

## 2018-02-04 MED ORDER — ONDANSETRON HCL 4 MG/2ML IJ SOLN: 4.0000 mg | INTRAMUSCULAR | Status: DC | PRN

## 2018-02-04 MED ORDER — DIBUCAINE 1 % RE OINT: 1.0000 "application " | TOPICAL_OINTMENT | RECTAL | Status: DC | PRN

## 2018-02-04 MED ORDER — ACETAMINOPHEN 325 MG PO TABS: 650.0000 mg | ORAL_TABLET | ORAL | Status: DC | PRN

## 2018-02-04 MED ORDER — ONDANSETRON HCL 4 MG PO TABS: 4.0000 mg | ORAL_TABLET | ORAL | Status: DC | PRN

## 2018-02-04 NOTE — Anesthesia Postprocedure Evaluation (Signed)
Anesthesia Post Note  Patient: Alcide GoodnessJessica Glaza  Procedure(s) Performed: AN AD HOC LABOR EPIDURAL  Patient location during evaluation: Mother Baby Anesthesia Type: Epidural Level of consciousness: awake and alert Pain management: pain level controlled Vital Signs Assessment: post-procedure vital signs reviewed and stable Respiratory status: spontaneous breathing, nonlabored ventilation and respiratory function stable Cardiovascular status: stable Postop Assessment: no headache, no backache, patient able to bend at knees and able to ambulate Anesthetic complications: no     Last Vitals:  Vitals:   02/04/18 1107 02/04/18 1618  BP: 118/71 123/69  Pulse: 82 79  Resp: 20 18  Temp: 36.9 C 36.9 C  SpO2:  98%    Last Pain:  Vitals:   02/04/18 1618  TempSrc: Oral  PainSc:                  Cleda MccreedyJoseph K Piscitello

## 2018-02-04 NOTE — Progress Notes (Signed)
Post Partum Day 1 Subjective: Doing well, no complaints.  Tolerating regular diet, pain with PO meds, voiding and ambulating without difficulty.  No CP SOB F/C N/V or leg pain No HA, change of vision, RUQ/epigastric pain  Objective: BP 106/62 (BP Location: Left Arm)   Pulse 63   Temp 98.4 F (36.9 C) (Oral)   Resp 18   Ht 5' 8.5" (1.74 m)   Wt 122.5 kg   LMP 05/05/2017 (Exact Date)   SpO2 98%   BMI 40.46 kg/m    Physical Exam:  General: NAD CV: RRR Pulm: nl effort, CTABL Lochia: moderate Uterine Fundus: fundus firm and below umbilicus DVT Evaluation: no cords, ttp LEs   Recent Labs    02/03/18 0107  HGB 11.9*  HCT 36.1  WBC 15.1*  PLT 215    Assessment/Plan: 27 y.o. G2P1011 postpartum day # 1  1. Continue routine postpartum care 2. O positive 3. Rubella Non-Immune (considering MMR) 4. Varicella Immune 5. TDAP given antepartum 6. Formula feeding/Contraception: Nexplanon    Rod Can, CNM

## 2018-02-05 NOTE — Progress Notes (Signed)
Discharged to home with NB 

## 2018-02-05 NOTE — Plan of Care (Signed)
Vs stable; up ad lib; tolerating regular diet; taking motrin for pain control 

## 2018-02-05 NOTE — Progress Notes (Signed)
D/c teaching completed.  Rubella vaccine given.

## 2018-02-05 NOTE — Discharge Instructions (Signed)
Postpartum Depression and Baby Blues The postpartum period begins right after the birth of a baby. During this time, there is often a great amount of joy and excitement. It is also a time of many changes in the life of the parents. Regardless of how many times a mother gives birth, each child brings new challenges and dynamics to the family. It is not unusual to have feelings of excitement along with confusing shifts in moods, emotions, and thoughts. All mothers are at risk of developing postpartum depression or the "baby blues." These mood changes can occur right after giving birth, or they may occur many months after giving birth. The baby blues or postpartum depression can be mild or severe. Additionally, postpartum depression can go away rather quickly, or it can be a long-term condition. What are the causes? Raised hormone levels and the rapid drop in those levels are thought to be a main cause of postpartum depression and the baby blues. A number of hormones change during and after pregnancy. Estrogen and progesterone usually decrease right after the delivery of your baby. The levels of thyroid hormone and various cortisol steroids also rapidly drop. Other factors that play a role in these mood changes include major life events and genetics. What increases the risk? If you have any of the following risks for the baby blues or postpartum depression, know what symptoms to watch out for during the postpartum period. Risk factors that may increase the likelihood of getting the baby blues or postpartum depression include:  Having a personal or family history of depression.  Having depression while being pregnant.  Having premenstrual mood issues or mood issues related to oral contraceptives.  Having a lot of life stress.  Having marital conflict.  Lacking a social support network.  Having a baby with special needs.  Having health problems, such as diabetes.  What are the signs or  symptoms? Symptoms of baby blues include:  Brief changes in mood, such as going from extreme happiness to sadness.  Decreased concentration.  Difficulty sleeping.  Crying spells, tearfulness.  Irritability.  Anxiety.  Symptoms of postpartum depression typically begin within the first month after giving birth. These symptoms include:  Difficulty sleeping or excessive sleepiness.  Marked weight loss.  Agitation.  Feelings of worthlessness.  Lack of interest in activity or food.  Postpartum psychosis is a very serious condition and can be dangerous. Fortunately, it is rare. Displaying any of the following symptoms is cause for immediate medical attention. Symptoms of postpartum psychosis include:  Hallucinations and delusions.  Bizarre or disorganized behavior.  Confusion or disorientation.  How is this diagnosed? A diagnosis is made by an evaluation of your symptoms. There are no medical or lab tests that lead to a diagnosis, but there are various questionnaires that a health care provider may use to identify those with the baby blues, postpartum depression, or psychosis. Often, a screening tool called the Edinburgh Postnatal Depression Scale is used to diagnose depression in the postpartum period. How is this treated? The baby blues usually goes away on its own in 1-2 weeks. Social support is often all that is needed. You will be encouraged to get adequate sleep and rest. Occasionally, you may be given medicines to help you sleep. Postpartum depression requires treatment because it can last several months or longer if it is not treated. Treatment may include individual or group therapy, medicine, or both to address any social, physiological, and psychological factors that may play a role in the   depression. Regular exercise, a healthy diet, rest, and social support may also be strongly recommended. Postpartum psychosis is more serious and needs treatment right away.  Hospitalization is often needed. Follow these instructions at home:  Get as much rest as you can. Nap when the baby sleeps.  Exercise regularly. Some women find yoga and walking to be beneficial.  Eat a balanced and nourishing diet.  Do little things that you enjoy. Have a cup of tea, take a bubble bath, read your favorite magazine, or listen to your favorite music.  Avoid alcohol.  Ask for help with household chores, cooking, grocery shopping, or running errands as needed. Do not try to do everything.  Talk to people close to you about how you are feeling. Get support from your partner, family members, friends, or other new moms.  Try to stay positive in how you think. Think about the things you are grateful for.  Do not spend a lot of time alone.  Only take over-the-counter or prescription medicine as directed by your health care provider.  Keep all your postpartum appointments.  Let your health care provider know if you have any concerns. Contact a health care provider if: You are having a reaction to or problems with your medicine. Get help right away if:  You have suicidal feelings.  You think you may harm the baby or someone else. This information is not intended to replace advice given to you by your health care provider. Make sure you discuss any questions you have with your health care provider. Document Released: 02/08/2004 Document Revised: 10/12/2015 Document Reviewed: 02/15/2013 Elsevier Interactive Patient Education  2017 [employer]  

## 2018-02-06 ENCOUNTER — Telehealth: Payer: Self-pay | Admitting: Obstetrics and Gynecology

## 2018-02-06 NOTE — Telephone Encounter (Signed)
Patient is schedule 03/20/18 with SDJ for nexplanon insertion

## 2018-02-10 NOTE — Telephone Encounter (Signed)
Noted. Will order to arrive by apt date/time. 

## 2018-02-17 NOTE — Telephone Encounter (Signed)
SDJ out of office 03/20/18. Sent mychart message and left voicemail for patient to call back to be schedule

## 2018-02-17 NOTE — Telephone Encounter (Signed)
Patient is schedule 03/24/18 at 2:30with SDJ for nexplanon insertion

## 2018-03-20 ENCOUNTER — Ambulatory Visit: Payer: Medicaid Other | Admitting: Obstetrics and Gynecology

## 2018-03-24 ENCOUNTER — Ambulatory Visit (INDEPENDENT_AMBULATORY_CARE_PROVIDER_SITE_OTHER): Payer: Medicaid Other | Admitting: Obstetrics and Gynecology

## 2018-03-24 ENCOUNTER — Encounter: Payer: Self-pay | Admitting: Obstetrics and Gynecology

## 2018-03-24 DIAGNOSIS — Z3049 Encounter for surveillance of other contraceptives: Secondary | ICD-10-CM

## 2018-03-24 DIAGNOSIS — Z30017 Encounter for initial prescription of implantable subdermal contraceptive: Secondary | ICD-10-CM

## 2018-03-24 NOTE — Progress Notes (Signed)
Postpartum Visit   Chief Complaint  Patient presents with  . 6 week post partum   History of Present Illness: Patient is a 27 y.o. G2P1011 presents for postpartum visit.  Date of delivery: 02/03/2018 Type of delivery: Vaginal delivery  Episiotomy No.  Laceration: no Pregnancy or labor problems:  no Any problems since the delivery:  no  Newborn Details:  SINGLETON :  1. Baby's name: Amberlyn. Birth weight: 7.1 Maternal Details:  Breast Feeding: bottle Post partum depression/anxiety noted:  no Edinburgh Post-Partum Depression Score:  2  Date of last PAP: 07/17/2017 NORMAL  Past Medical History:  Diagnosis Date  . Hepatitis C   . History of abnormal cervical Pap smear    since 2016  . History of abnormal cervical Pap smear    since 12/28/2014  . HPV (human papilloma virus) infection   . Low grade squamous intraepithelial lesion (LGSIL) on cervical Pap smear 12/30/2016   with positive HRHPV  . Migraine   . MRSA (methicillin resistant staph aureus) culture positive 01/2016   facial cellulitis  . 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 COLONOSCOPY  2015/2016?   abdominal pain  . COLPOSCOPY  2017   B9 biopsies  . TONSILLECTOMY      Prior to Admission medications   Medication Sig Start Date End Date Taking? Authorizing Provider  Prenatal Vit-Fe Fumarate-FA (MULTIVITAMIN-PRENATAL) 27-0.8 MG TABS tablet Take 1 tablet by mouth daily at 12 noon.    [provider]    Allergies  Allergen Reactions  . Latex Itching and Rash     Social History   Socioeconomic History  . Marital status: Married    Spouse name: Francesco Runner  . Number of children: Not on file  . Years of education: Not on file  . Highest education level: GED or equivalent  Occupational History  . Not on file  Social Needs  . Financial resource strain: Not hard at all  . Food insecurity:   Worry: Never true    Inability: Never true  . Transportation needs:    Medical: No    Non-medical: No  Tobacco Use  . Smoking status: Former Smoker    Years: 2.00    Types: E-cigarettes, Cigarettes  . Smokeless tobacco: Never Used  . Tobacco comment: Former smoker  Substance and Sexual Activity  . Alcohol use: No  . Drug use: No    Comment: Past user of heroin, cocaine, MJ, meth-ast used Jan 2017  . Sexual activity: Yes    Partners: Male    Birth control/protection: None  Lifestyle  . Physical activity:    Days per week: 0 days    Minutes per session: 0 min  . Stress: To some extent  Relationships  . Social connections:    Talks on phone: More than three times a week    Gets together: More than three times a week    Attends religious service: 1 to 4 times per year    Active member of club or organization: Yes    Attends meetings of clubs or organizations: 1 to 4 times per year    Relationship status: Married  . Intimate partner violence:    Fear of current or ex partner: Not on file    Emotionally abused: Not on file    Physically abused: Not on file    Forced sexual activity: Not on file  Other Topics Concern  . Not on file  Social History Narrative  . Not on file    Family History  Problem Relation Age of Onset  . Diabetes Paternal Grandfather   . Depression Mother   . Lung cancer Maternal Grandmother   . Lung cancer Maternal Grandfather   . COPD Maternal Grandfather   . Cancer Neg Hx   . Heart disease Neg Hx   . Stroke Neg Hx     Review of Systems  Constitutional: Negative.   HENT: Negative.   Eyes: Negative.   Respiratory: Negative.   Cardiovascular: Negative.   Gastrointestinal: Negative.   Genitourinary: Negative.   Musculoskeletal: Negative.   Skin: Negative.   Neurological: Negative.   Psychiatric/Behavioral: Negative.      Physical Exam BP 124/78   Ht 5\' 8"  (1.727 m)   Wt 252 lb (114.3 kg)   BMI 38.32 kg/m   Physical Exam    Constitutional: She is oriented to person, place, and time. She appears well-developed and well-nourished. No distress.  Genitourinary: Vagina normal and uterus normal. Pelvic exam was performed with patient supine. There is no rash, tenderness, lesion or injury on the right labia. There is no rash, tenderness, lesion or injury on the left labia. Right adnexum does not display mass, does not display tenderness and does not display fullness. Left adnexum does not display mass, does not display tenderness and does not display fullness. Cervix does not exhibit motion tenderness, lesion or polyp.  HENT:  Head: Normocephalic and atraumatic.  Eyes: EOM are normal. No scleral icterus.  Neck: Normal range of motion. Neck supple.  Cardiovascular: Normal rate and regular rhythm.  Pulmonary/Chest: Effort normal and breath sounds normal. No respiratory distress. She has no wheezes. She has no rales.  Abdominal: Soft. Bowel sounds are normal. She exhibits no distension and no mass. There is no tenderness. There is no rebound and no guarding.  Musculoskeletal: Normal range of motion. She exhibits no edema.  Neurological: She is alert and oriented to person, place, and time. No cranial nerve deficit.  Skin: Skin is warm and dry. No erythema.  Psychiatric: She has a normal mood and affect. Her behavior is normal. Judgment normal.    Female Chaperone present during breast and/or pelvic exam.   GYNECOLOGY PROCEDURE NOTE  Patient is a 27 y.o. [id number] presenting for Nexplanon insertion as her desires means of contraception.  She provided informed consent, signed copy in the chart, time out was performed. Pregnancy test was negative, with self reported LMP of No LMP recorded.  She understands that Nexplanon is a progesterone only therapy, and that patients often patients have irregular and unpredictable vaginal bleeding or amenorrhea. She understands that other side effects are possible related to systemic  progesterone, including but not limited to, headaches, breast tenderness, nausea, and irritability. While effective at preventing pregnancy long acting reversible contraceptives do not prevent transmission of sexually transmitted diseases and use of barrier methods for this purpose was discussed. The placement procedure for Nexplanon was reviewed with the patient in detail including risks of nerve injury, infection, bleeding and injury to other muscles or tendons. She understands that the Nexplanon implant is good for 3 years and needs to be removed at the end of that time.  She understands that Nexplanon is an extremely effective option for contraception, with failure rate of <1%. This information is reviewed today and all questions were answered. Informed consent was obtained, both verbally and written.   The patient is  healthy and has no contraindications to Nexplanon use. Urine pregnancy test was performed today and was negative.  Procedure Appropriate time out taken.  Patient placed in dorsal supine with left arm above head, elbow flexed at 90 degrees, arm resting on examination table with hand behind her head.  The bicipital grove was palpated and site 8-10cm proximal to the medial epicondyle was indentified.  Per the manufacturer's recommendations, the insertion site was marked along a line 3-5 cm posterior (toward the triceps) to the bicipital groove and at 8-10 cm medial to the medial epicondyle. The insertion site was prepped with a two betadine swabs and then injected with 3 cc of 1% lidocaine without epinephrine.  Nexplanon removed form sterile blister packaging,  Device confirmed in needle, before inserting full length of needle, tenting up the skin as the needle was advance.  The drug eluting rod was then deployed by pulling back the slider per the manufactures recommendation.  The implant was palpable by the clinician as well as the patient.  The insertion site covered dressed with a 1/2"  steri-strip before applying  a kerlex bandage pressure dressing..Minimal blood loss was noted during the procedure.  The patient tolerated the procedure well.   She was instructed to wear the bandage for 24 hours, call with any signs of infection.  She was given the Nexplanon card and instructed to have the rod removed in 3 years.  Assessment: 27 y.o. G2P1011 presenting for 6 week postpartum visit  Plan: Problem List Items Addressed This Visit    None    Visit Diagnoses    Postpartum care and examination    -  Primary   Nexplanon insertion          1) Contraception Education given regarding options for contraception, including Nexplanon, which was placed today.  2)  Pap - ASCCP guidelines and rational discussed.  Patient opts for routine screening interval  3) Patient underwent screening for postpartum depression with no concerns noted.  4) Follow up 1 year for routine annual exam  Thomasene Mohair, MD 03/24/2018 6:29 PM

## 2018-07-23 IMAGING — US US ABDOMEN LIMITED
1 series · 14 of 25 positions shown · non-contrast
Comparison: 03/13/2017.

CLINICAL DATA: HCV.  Elevated LFTs.

EXAM:
ULTRASOUND ABDOMEN LIMITED RIGHT UPPER QUADRANT

[Series 1: us abdomen limited · 0.16mm/px · 14 of 71 slices shown]
[im 1/71]
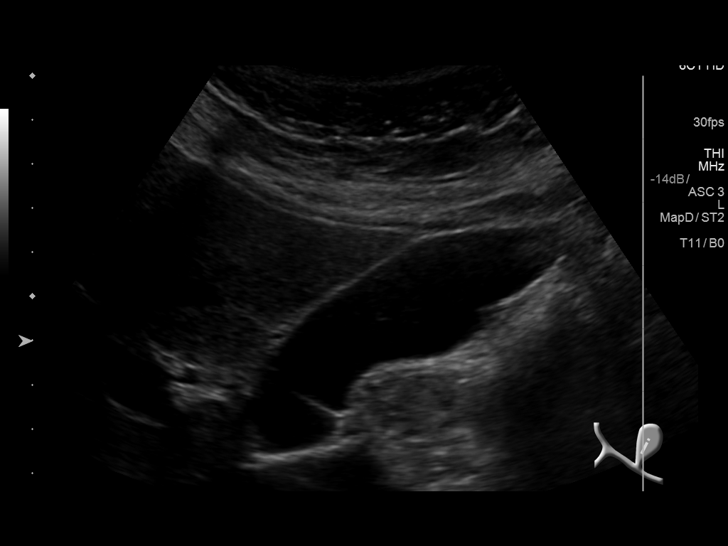
[im 6/71]
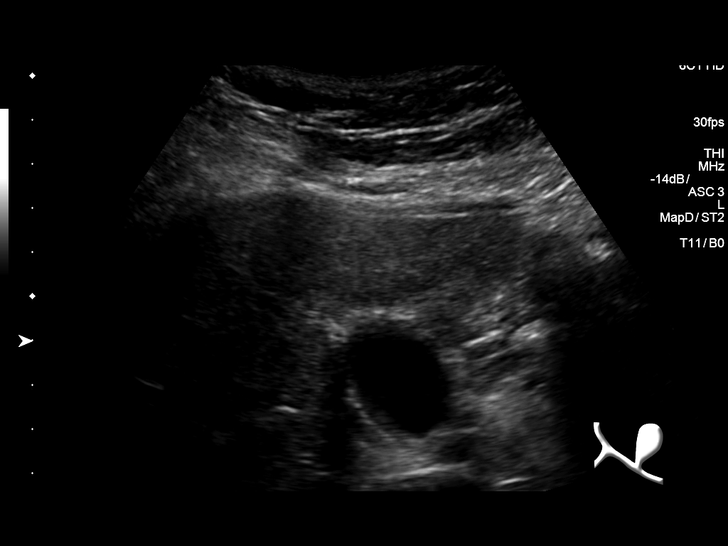
[im 12/71]
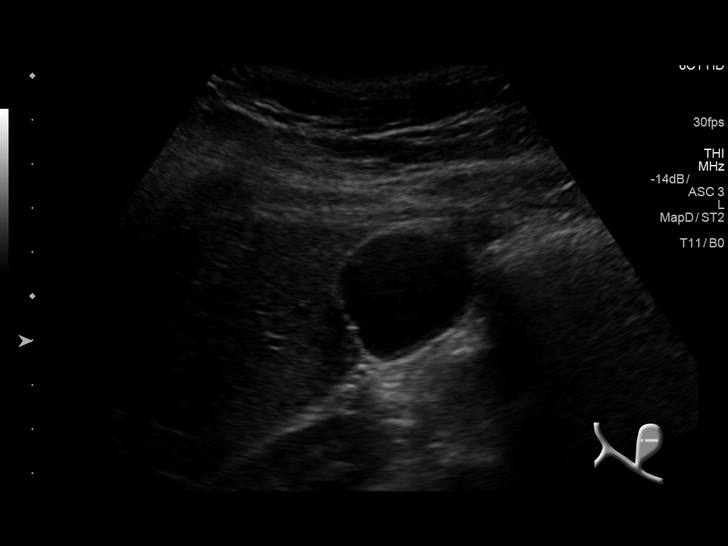
[im 18/71]
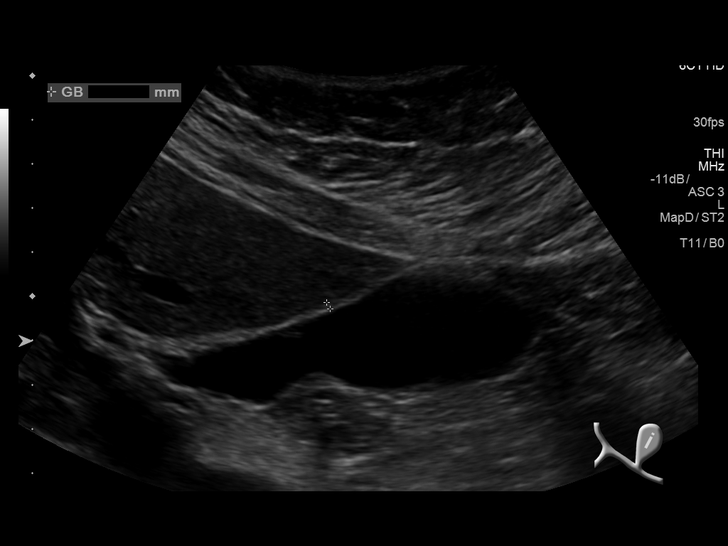
[im 24/71]
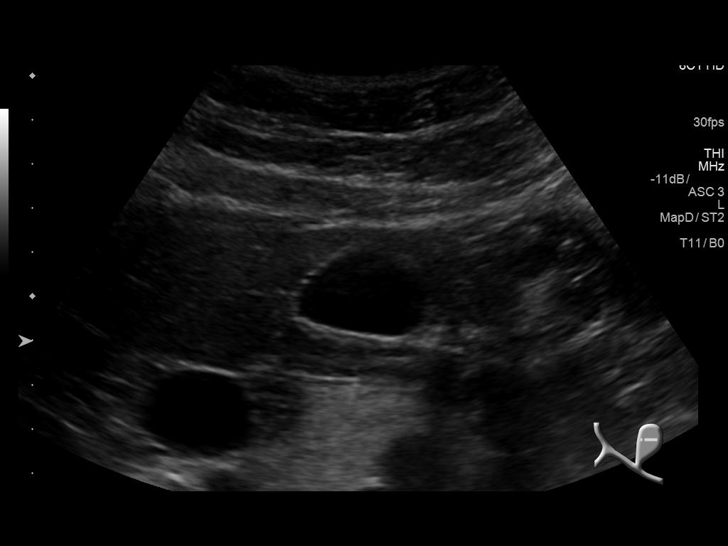
[im 27/71]
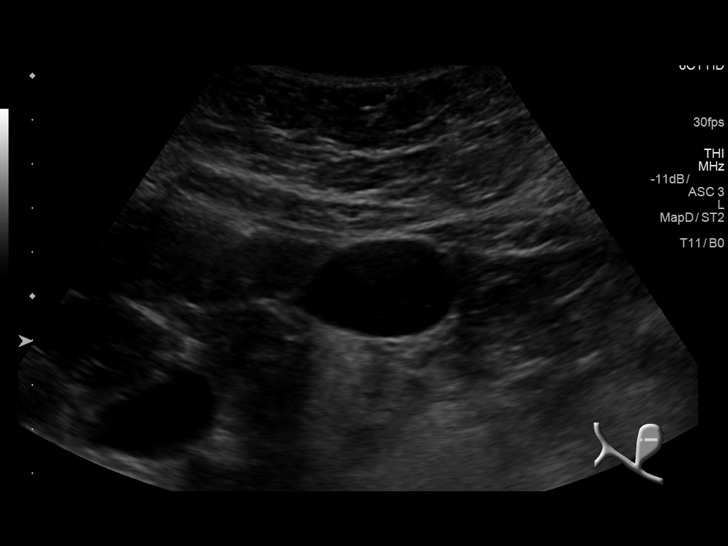
[im 33/71]
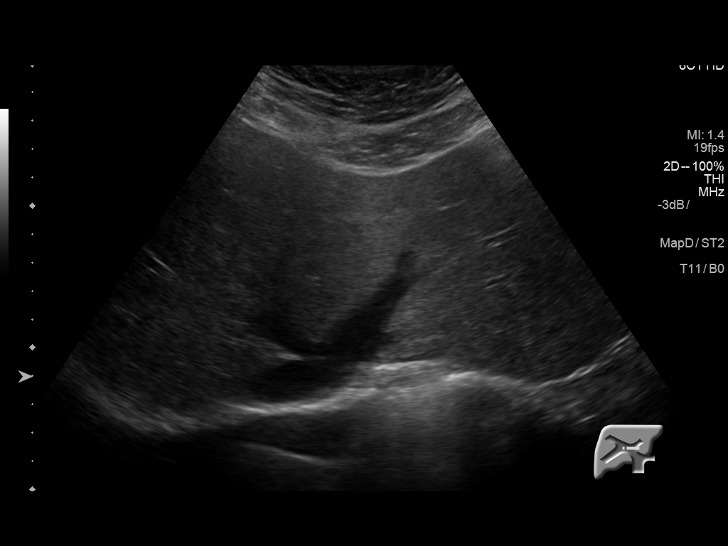
[im 38/71]
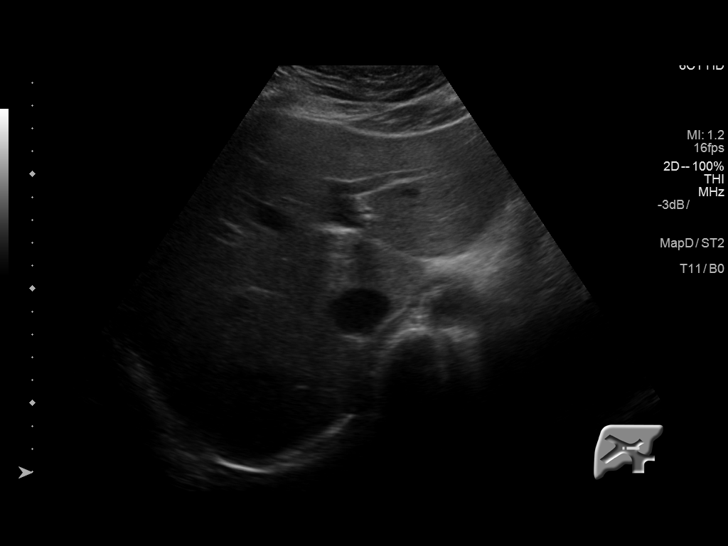
[im 44/71]
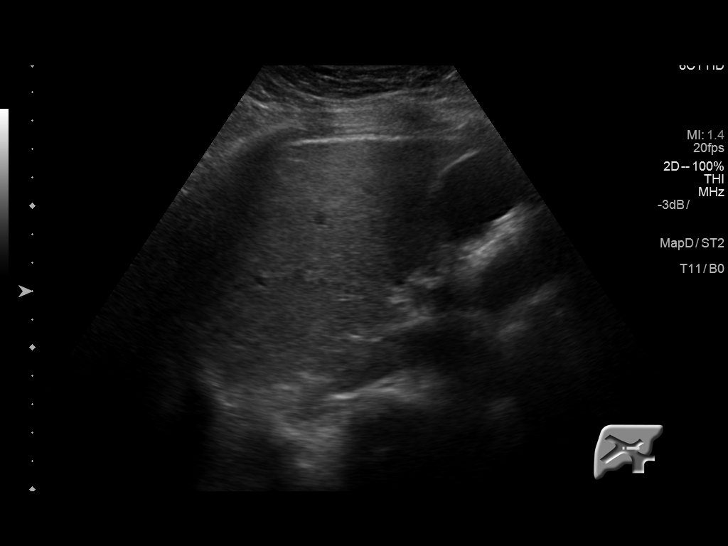
[im 47/71]
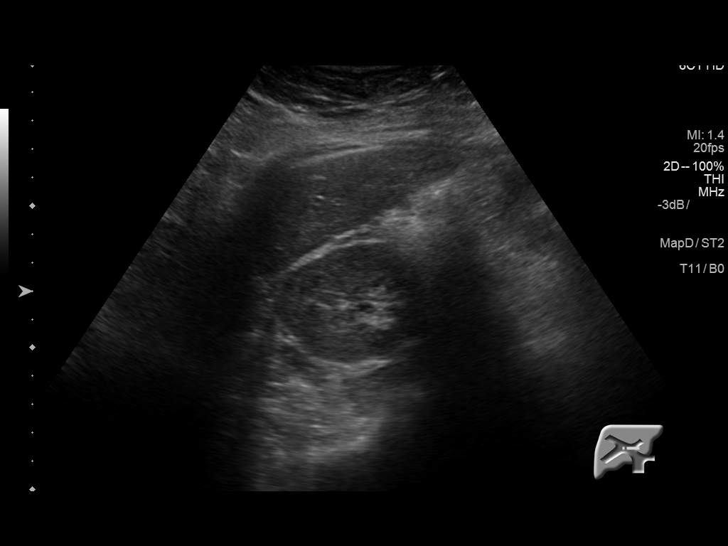
[im 53/71]
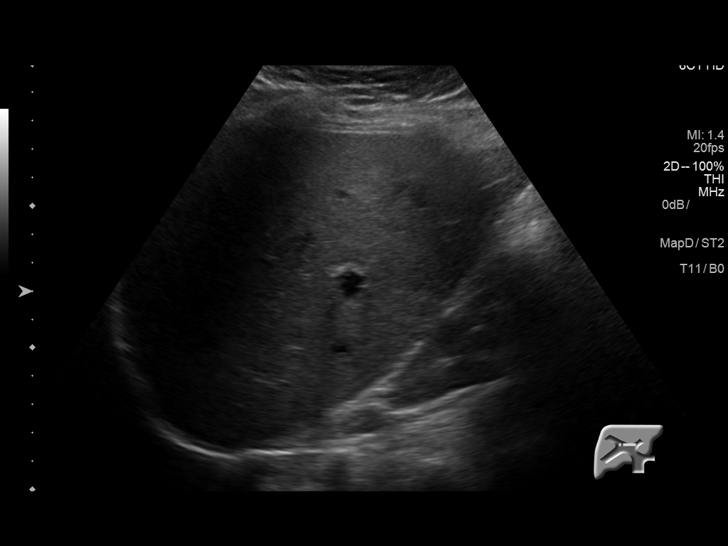
[im 59/71]
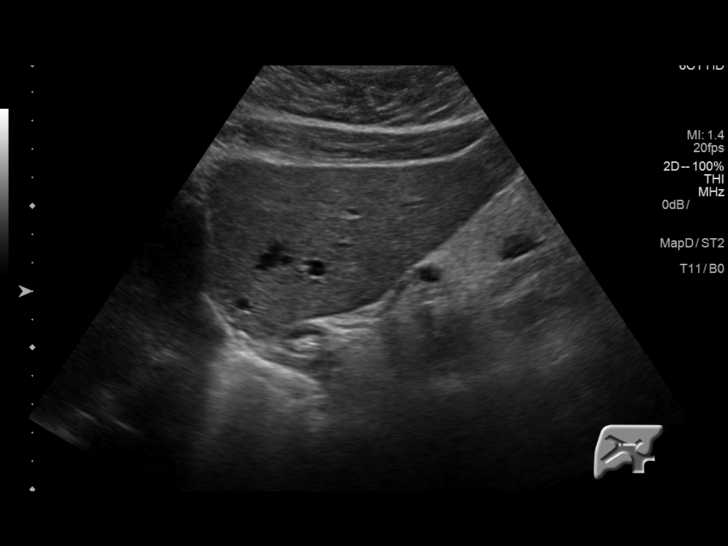
[im 65/71]
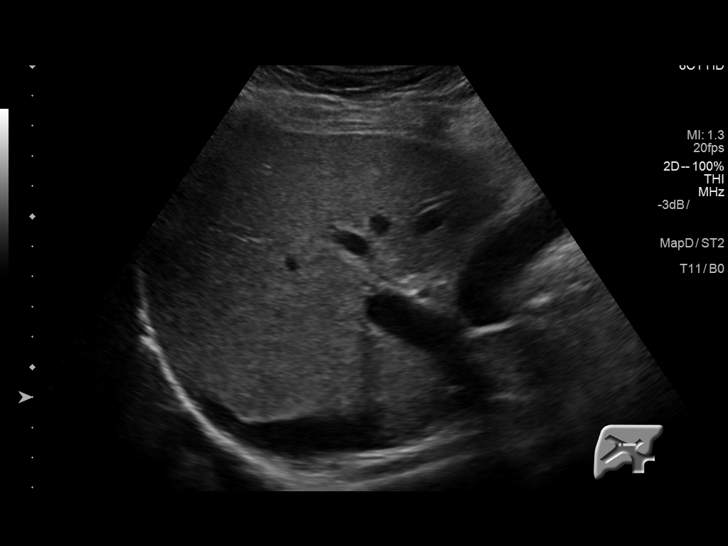
[im 71/71]
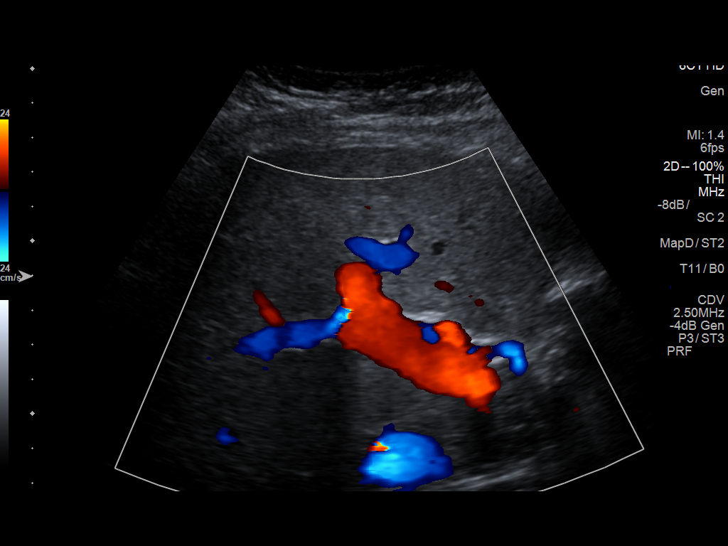

[14 of 25 positions shown; findings below may reference images not displayed]

FINDINGS: Gallbladder:

No gallstones or wall thickening visualized. No sonographic Murphy
sign noted by sonographer.

Common bile duct:

Diameter: 2.0 mm

Liver:

No focal lesion identified. Within normal limits in parenchymal
echogenicity. Portal vein is patent on color Doppler imaging with
normal direction of blood flow towards the liver.
IMPRESSION: No acute or focal abnormality.

## 2019-07-06 NOTE — Telephone Encounter (Signed)
Nexplanon rcvd/charged 03/24/18
